# Patient Record
Sex: Male | Born: 1950
Health system: Southern US, Community
[De-identification: ages and names within clinical notes are randomized; demographics above are authoritative.]

## PROBLEM LIST (undated history)

## (undated) DIAGNOSIS — T7840XA Allergy, unspecified, initial encounter: Secondary | ICD-10-CM

## (undated) DIAGNOSIS — K649 Unspecified hemorrhoids: Secondary | ICD-10-CM

## (undated) DIAGNOSIS — H269 Unspecified cataract: Secondary | ICD-10-CM

## (undated) HISTORY — DX: Unspecified cataract: H26.9

## (undated) HISTORY — DX: Allergy, unspecified, initial encounter: T78.40XA

## (undated) HISTORY — PX: ACHILLES TENDON REPAIR: SUR1153

## (undated) HISTORY — DX: Unspecified hemorrhoids: K64.9

---

## 2017-02-28 ENCOUNTER — Ambulatory Visit (HOSPITAL_BASED_OUTPATIENT_CLINIC_OR_DEPARTMENT_OTHER)
Admission: EM | Admit: 2017-02-28 | Discharge: 2017-03-02 | Disposition: A | Discharge status: Home / Self Care | Payer: Self-pay | Attending: General Surgery | Admitting: General Surgery

## 2017-02-28 ENCOUNTER — Encounter (HOSPITAL_BASED_OUTPATIENT_CLINIC_OR_DEPARTMENT_OTHER): Payer: Self-pay | Admitting: *Deleted

## 2017-02-28 ENCOUNTER — Ambulatory Visit (HOSPITAL_BASED_OUTPATIENT_CLINIC_OR_DEPARTMENT_OTHER)
Admit: 2017-02-28 | Discharge: 2017-02-28 | Disposition: A | Discharge status: Home / Self Care | Payer: Self-pay | Attending: Emergency Medicine | Admitting: Emergency Medicine

## 2017-02-28 ENCOUNTER — Emergency Department (HOSPITAL_BASED_OUTPATIENT_CLINIC_OR_DEPARTMENT_OTHER)
Admission: EM | Admit: 2017-02-28 | Discharge: 2017-02-28 | Disposition: A | Discharge status: Home / Self Care | Payer: Self-pay | Source: Home / Self Care | Attending: Emergency Medicine | Admitting: Emergency Medicine

## 2017-02-28 DIAGNOSIS — D72829 Elevated white blood cell count, unspecified: Secondary | ICD-10-CM | POA: Insufficient documentation

## 2017-02-28 DIAGNOSIS — R1011 Right upper quadrant pain: Secondary | ICD-10-CM | POA: Insufficient documentation

## 2017-02-28 DIAGNOSIS — K819 Cholecystitis, unspecified: Secondary | ICD-10-CM

## 2017-02-28 DIAGNOSIS — K8000 Calculus of gallbladder with acute cholecystitis without obstruction: Secondary | ICD-10-CM | POA: Insufficient documentation

## 2017-02-28 DIAGNOSIS — Z79899 Other long term (current) drug therapy: Secondary | ICD-10-CM | POA: Insufficient documentation

## 2017-02-28 DIAGNOSIS — K810 Acute cholecystitis: Secondary | ICD-10-CM | POA: Diagnosis present

## 2017-02-28 LAB — COMPREHENSIVE METABOLIC PANEL
ALK PHOS: 51 U/L (ref 38–126)
ALT: 31 U/L (ref 17–63)
ANION GAP: 8 (ref 5–15)
AST: 34 U/L (ref 15–41)
Albumin: 4.7 g/dL (ref 3.5–5.0)
BILIRUBIN TOTAL: 1 mg/dL (ref 0.3–1.2)
BUN: 15 mg/dL (ref 6–20)
CALCIUM: 9.5 mg/dL (ref 8.9–10.3)
CO2: 29 mmol/L (ref 22–32)
Chloride: 99 mmol/L — ABNORMAL LOW (ref 101–111)
Creatinine, Ser: 0.9 mg/dL (ref 0.61–1.24)
GLUCOSE: 134 mg/dL — AB (ref 65–99)
Potassium: 4 mmol/L (ref 3.5–5.1)
Sodium: 136 mmol/L (ref 135–145)
TOTAL PROTEIN: 7.9 g/dL (ref 6.5–8.1)

## 2017-02-28 LAB — CBC WITH DIFFERENTIAL/PLATELET
Basophils Absolute: 0 10*3/uL (ref 0.0–0.1)
Basophils Relative: 0 %
Eosinophils Absolute: 0 10*3/uL (ref 0.0–0.7)
Eosinophils Relative: 0 %
HEMATOCRIT: 46.8 % (ref 39.0–52.0)
HEMOGLOBIN: 16 g/dL (ref 13.0–17.0)
Lymphocytes Relative: 12 %
Lymphs Abs: 1.4 10*3/uL (ref 0.7–4.0)
MCH: 30.2 pg (ref 26.0–34.0)
MCHC: 34.2 g/dL (ref 30.0–36.0)
MCV: 88.3 fL (ref 78.0–100.0)
MONOS PCT: 4 %
Monocytes Absolute: 0.4 10*3/uL (ref 0.1–1.0)
NEUTROS ABS: 9.7 10*3/uL — AB (ref 1.7–7.7)
NEUTROS PCT: 84 %
Platelets: 280 10*3/uL (ref 150–400)
RBC: 5.3 MIL/uL (ref 4.22–5.81)
RDW: 13 % (ref 11.5–15.5)
WBC: 11.6 10*3/uL — ABNORMAL HIGH (ref 4.0–10.5)

## 2017-02-28 LAB — MRSA PCR SCREENING: MRSA by PCR: NEGATIVE

## 2017-02-28 LAB — LIPASE, BLOOD: Lipase: 38 U/L (ref 11–51)

## 2017-02-28 MED ORDER — HYDROCODONE-ACETAMINOPHEN 5-325 MG PO TABS: 1.0000 | ORAL_TABLET | Freq: Four times a day (QID) | ORAL | 0 refills | Status: DC | PRN

## 2017-02-28 MED ORDER — DIPHENHYDRAMINE HCL 50 MG/ML IJ SOLN: 12.5000 mg | Freq: Four times a day (QID) | INTRAMUSCULAR | Status: DC | PRN

## 2017-02-28 MED ORDER — DEXTROSE 5 % IV SOLN
2.0000 g | INTRAVENOUS | Status: DC
  Administered 2017-02-28: 2 g via INTRAVENOUS
  Filled 2017-02-28: qty 2

## 2017-02-28 MED ORDER — OMEPRAZOLE 40 MG PO CPDR: 40.0000 mg | DELAYED_RELEASE_CAPSULE | Freq: Every day | ORAL | 0 refills | Status: DC | PRN

## 2017-02-28 MED ORDER — KCL IN DEXTROSE-NACL 20-5-0.9 MEQ/L-%-% IV SOLN
INTRAVENOUS | Status: DC
  Administered 2017-02-28: 13:00:00 via INTRAVENOUS
  Administered 2017-03-01: 125 mL/h via INTRAVENOUS
  Filled 2017-02-28 (×4): qty 1000

## 2017-02-28 MED ORDER — DEXTROSE 5 % IV SOLN
2.0000 g | INTRAVENOUS | Status: DC
Start: 2017-03-01 — End: 2017-03-01
  Administered 2017-03-01: 2 g via INTRAVENOUS
  Filled 2017-02-28: qty 2

## 2017-02-28 MED ORDER — TAMSULOSIN HCL 0.4 MG PO CAPS
0.4000 mg | ORAL_CAPSULE | Freq: Every day | ORAL | Status: DC
  Administered 2017-02-28 – 2017-03-01 (×2): 0.4 mg via ORAL
  Filled 2017-02-28 (×2): qty 1

## 2017-02-28 MED ORDER — ONDANSETRON HCL 4 MG/2ML IJ SOLN
4.0000 mg | Freq: Once | INTRAMUSCULAR | Status: AC
  Administered 2017-02-28: 4 mg via INTRAVENOUS
  Filled 2017-02-28: qty 2

## 2017-02-28 MED ORDER — MORPHINE SULFATE (PF) 10 MG/ML IV SOLN
1.0000 mg | INTRAVENOUS | Status: DC | PRN
  Administered 2017-02-28: 3 mg via INTRAVENOUS
  Filled 2017-02-28: qty 1

## 2017-02-28 MED ORDER — ONDANSETRON HCL 4 MG/2ML IJ SOLN: 4.0000 mg | Freq: Four times a day (QID) | INTRAMUSCULAR | Status: DC | PRN

## 2017-02-28 MED ORDER — ONDANSETRON 4 MG PO TBDP: 4.0000 mg | ORAL_TABLET | Freq: Four times a day (QID) | ORAL | 0 refills | Status: DC | PRN

## 2017-02-28 MED ORDER — DIPHENHYDRAMINE HCL 12.5 MG/5ML PO ELIX: 12.5000 mg | ORAL_SOLUTION | Freq: Four times a day (QID) | ORAL | Status: DC | PRN

## 2017-02-28 MED ORDER — MORPHINE SULFATE (PF) 2 MG/ML IV SOLN
2.0000 mg | INTRAVENOUS | Status: DC | PRN
  Administered 2017-02-28: 2 mg via INTRAVENOUS
  Filled 2017-02-28: qty 1

## 2017-02-28 MED ORDER — KETOROLAC TROMETHAMINE 30 MG/ML IJ SOLN
15.0000 mg | Freq: Once | INTRAMUSCULAR | Status: AC
  Administered 2017-02-28: 15 mg via INTRAVENOUS
  Filled 2017-02-28: qty 1

## 2017-02-28 MED ORDER — ONDANSETRON 4 MG PO TBDP: 4.0000 mg | ORAL_TABLET | Freq: Four times a day (QID) | ORAL | Status: DC | PRN

## 2017-02-28 MED ORDER — PANTOPRAZOLE SODIUM 40 MG IV SOLR
40.0000 mg | Freq: Once | INTRAVENOUS | Status: AC
  Administered 2017-02-28: 40 mg via INTRAVENOUS
  Filled 2017-02-28: qty 40

## 2017-02-28 MED FILL — HYDROCODON-APAP 5-325: 5-325 | 2 days supply | Qty: 15 | Fill #0

## 2017-02-28 MED FILL — ONDANSETRON ODT 4 MG TABLET: 4 | 5 days supply | Qty: 20 | Fill #0

## 2017-02-28 MED FILL — OMEPRAZOLE DR 40 MG CAPSULE: 40 | 30 days supply | Qty: 30 | Fill #0

## 2017-02-28 NOTE — ED Provider Notes (Signed)
TIME SEEN: 1:51 AM  CHIEF COMPLAINT: Abdominal pain  HPI: Patient is a 66 year old male with no significant past medical history who presents to the emergency department with complaints of right upper quadrant moderate sharp abdominal pain that started after he ate a ham and cheese sandwich for dinner at 8 PM. Reports pain progressively getting worse. Has had chills but no nausea, vomiting or diarrhea. No dysuria or hematuria. Has never had similar symptoms before. States he has never been told he has gallstones, gastric or duodenal ulcers. No history of endoscopy. Does not drink alcohol regularly, last was 3 years ago. Does not take regular NSAIDs. Did try Tylenol prior to arrival without relief. No history of abdominal surgeries. No aggravating or relieving factors. No radiation of pain. Denies any chest pain. Reports he has chronic shortness of breath that is unchanged.  ROS: See HPI Constitutional: no fever  Eyes: no drainage  ENT: no runny nose   Cardiovascular:  no chest pain  Resp: no new or worsening SOB  GI: no vomiting GU: no dysuria Integumentary: no rash  Allergy: no hives  Musculoskeletal: no leg swelling  Neurological: no slurred speech ROS otherwise negative  PAST MEDICAL HISTORY/PAST SURGICAL HISTORY:  History reviewed. No pertinent past medical history.  MEDICATIONS:  Prior to Admission medications   Medication Sig Start Date End Date Taking? Authorizing Provider  saw palmetto 160 MG capsule Take 160 mg by mouth 2 (two) times daily.   Yes [provider]    ALLERGIES:  Not on File  SOCIAL HISTORY:  Social History  Substance Use Topics  . Smoking status: Never Smoker  . Smokeless tobacco: Never Used  . Alcohol use No    FAMILY HISTORY: History reviewed. No pertinent family history.  EXAM: BP (!) 168/102 (BP Location: Right Arm)   Pulse 79   Temp 97.7 F (36.5 C) (Oral)   Resp 16   Ht 5\' 3"  (1.6 m)   Wt 81.6 kg (180 lb)   SpO2 96%   BMI  31.89 kg/m  CONSTITUTIONAL: Alert and oriented and responds appropriately to questions. Well-appearing; well-nourished HEAD: Normocephalic EYES: Conjunctivae clear, pupils appear equal, EOMI ENT: normal nose; moist mucous membranes NECK: Supple, no meningismus, no nuchal rigidity, no LAD  CARD: RRR; S1 and S2 appreciated; no murmurs, no clicks, no rubs, no gallops RESP: Normal chest excursion without splinting or tachypnea; breath sounds clear and equal bilaterally; no wheezes, no rhonchi, no rales, no hypoxia or respiratory distress, speaking full sentences ABD/GI: Normal bowel sounds; non-distended; soft, Mildly tender in the right upper quadrant with negative Murphy sign, no rebound, no guarding, no peritoneal signs, no hepatosplenomegaly BACK:  The back appears normal and is non-tender to palpation, there is no CVA tenderness EXT: Normal ROM in all joints; non-tender to palpation; no edema; normal capillary refill; no cyanosis, no calf tenderness or swelling    SKIN: Normal color for age and race; warm; no rash NEURO: Moves all extremities equally PSYCH: The patient's mood and manner are appropriate. Grooming and personal hygiene are appropriate.  MEDICAL DECISION MAKING: Patient here with complaints of abdominal pain. He has tenderness in the right upper quadrant but negative Murphy sign. Afebrile here. No tenderness in the epigastric region. Denies burning, sour, bitter taste in his mouth. Denies that this feels like heartburn. No chest pain or new shortness of breath. Differential includes cholelithiasis, pancreatitis, gastritis. I feel is less likely cholecystitis, colitis, appendicitis, diverticulitis, bowel obstruction. Doubt ACS, dissection.  At this time  unfortunately we do not have ultrasound available at this hospital. I do not feel he needs emergent CT imaging unless significant lab abnormalities. Will obtain labs. He did drive himself to the emergency department and would like to  drive himself home if possible. Will treat symptoms with low-dose Toradol, Zofran, Protonix and reassess.  ED PROGRESS: 3:00 AM  Pt has a mild leukocytosis with left shift. Otherwise normal labs with normal creatinine, LFTs and lipase. Patient reports feeling much better after above medications and has been able to sleep comfortably. We have had a lengthy discussion on the plan of care. I have discussed with him that I do not feel CT imaging is the best study to do at this time to evaluate his gallbladder and would expose him to potentially unnecessary radiation. We have discussed the possibility of being transferred to another emergency department tonight have an ultrasound of his gallbladder versus coming back later in the morning to have an ultrasound. He would prefer to come back later in the morning for an ultrasound here at [facility] which I feel is reasonable given he does not appear toxic, septic. He is afebrile at this time and asymptomatic. His abdomen is benign. He has a negative Murphy sign here. His LFTs and lipase are normal. Nothing at this time to suggest gallstone pancreatitis, choledocholithiasis. I did discuss with him that this could be cholelithiasis, biliary colic causing his pain and have recommended if symptoms continue he follow-up with general surgery if his ultrasound is positive for gallstones. Have given her instructions on how to alter his diet and will discharge with prescriptions for Vicodin, Zofran and omeprazole use as needed. I did discuss with him if his ultrasound is unremarkable I recommend close PCP follow-up if symptoms continue as this could be GERD, gastritis. We discussed at length return precautions. Patient verbalizes understanding and is comfortable with this plan.   At this time, I do not feel there is any life-threatening condition present. I have reviewed and discussed all results (EKG, imaging, lab, urine as appropriate) and exam findings with  patient/family. I have reviewed nursing notes and appropriate previous records.  I feel the patient is safe to be discharged home without further emergent workup and can continue workup as an outpatient as needed. Discussed usual and customary return precautions. Patient/family verbalize understanding and are comfortable with this plan.  Outpatient follow-up has been provided if needed. All questions have been answered.      Emmelina Mcloughlin, Delice Bison, DO 02/28/17 [phone]

## 2017-02-28 NOTE — ED Notes (Signed)
Pt verbalizes understanding of d/c instructions and denies any further needs at this time. 

## 2017-02-28 NOTE — ED Triage Notes (Signed)
Pt reports he received an ultrasound this morning which revealed gallstones. Reports being instructed by MD to check into ED.

## 2017-02-28 NOTE — ED Provider Notes (Signed)
[facility] Provider Note   CSN: [id number] Arrival date & time: 02/28/17  3295     History   Chief Complaint Chief Complaint  Patient presents with  . Cholelithiasis    HPI John Zamora is a 66 y.o. male.  Patient is a 66 year old male with no significant past medical history. He presents for evaluation of abdominal pain. He was seen yesterday evening here. He was found to have normal LFTs and lipase, however a mildly elevated white count. He returned today as instructed for an ultrasound. This revealed gallbladder wall thickening with multiple gallstones.    The history is provided by the patient.    History reviewed. No pertinent past medical history.  Patient Active Problem List   Diagnosis Date Noted  . Acute cholecystitis 02/28/2017    Past Surgical History:  Procedure Laterality Date  . ACHILLES TENDON REPAIR Right        Home Medications    Prior to Admission medications   Medication Sig Start Date End Date Taking? Authorizing Provider  HYDROcodone-acetaminophen (NORCO/VICODIN) 5-325 MG tablet Take 1-2 tablets by mouth every 6 (six) hours as needed. 02/28/17  Yes Ward, Delice Bison, DO  omeprazole (PRILOSEC) 40 MG capsule Take 1 capsule (40 mg total) by mouth daily as needed. As needed for acid reflux 02/28/17  Yes Ward, Kristen N, DO  ondansetron (ZOFRAN ODT) 4 MG disintegrating tablet Take 1 tablet (4 mg total) by mouth every 6 (six) hours as needed for nausea or vomiting. 02/28/17  Yes Ward, Delice Bison, DO  saw palmetto 160 MG capsule Take 160 mg by mouth 2 (two) times daily.   Yes [provider]    Family History No family history on file.  Social History Social History  Substance Use Topics  . Smoking status: Never Smoker  . Smokeless tobacco: Never Used  . Alcohol use No     Allergies   Patient has no known allergies.   Review of Systems Review of Systems  All other systems reviewed and are negative.    Physical  Exam Updated Vital Signs BP (!) 145/103 (BP Location: Left Arm)   Pulse 94   Temp 98.4 F (36.9 C) (Oral)   Resp 18   Ht 5\' 3"  (1.6 m)   Wt 81.6 kg (180 lb)   SpO2 96%   BMI 31.89 kg/m   Physical Exam  Constitutional: He is oriented to person, place, and time. He appears well-developed and well-nourished. No distress.  HENT:  Head: Normocephalic and atraumatic.  Mouth/Throat: Oropharynx is clear and moist.  Neck: Normal range of motion. Neck supple.  Cardiovascular: Normal rate and regular rhythm.  Exam reveals no friction rub.   No murmur heard. Pulmonary/Chest: Effort normal and breath sounds normal. No respiratory distress. He has no wheezes. He has no rales.  Abdominal: Soft. Bowel sounds are normal. He exhibits no distension. There is tenderness. There is no rebound and no guarding.  There is tenderness to palpation in the epigastric region and right upper quadrant.  Musculoskeletal: Normal range of motion. He exhibits no edema.  Neurological: He is alert and oriented to person, place, and time. Coordination normal.  Skin: Skin is warm and dry. He is not diaphoretic.  Nursing note and vitals reviewed.    ED Treatments / Results  Labs (all labs ordered are listed, but only abnormal results are displayed) Labs Reviewed - No data to display  EKG  EKG Interpretation None       Radiology  US Abdomen Limited Ruq/gall Bladder  Result Date: 02/28/2017 CLINICAL DATA:  Right upper quadrant pain EXAM: US ABDOMEN LIMITED - RIGHT UPPER QUADRANT COMPARISON:  None. FINDINGS: Gallbladder: Well distended with multiple gallstones within. Gallbladder wall thickening is noted. Common bile duct: Poorly visualized Liver: No focal lesion identified. Within normal limits in parenchymal echogenicity. IMPRESSION: Multiple gallstones with gallbladder wall thickening. In the appropriate clinical setting this would be consistent with acute cholecystitis. Electronically Signed   By: Inez Catalina  M.D.   On: 02/28/2017 08:36    Procedures Procedures (including critical care time)  Medications Ordered in ED Medications  dextrose 5 % and 0.9 % NaCl with KCl 20 mEq/L infusion (not administered)  cefTRIAXone (ROCEPHIN) 2 g in dextrose 5 % 50 mL IVPB (not administered)  morphine 2 MG/ML injection 2 mg (not administered)  diphenhydrAMINE (BENADRYL) 12.5 MG/5ML elixir 12.5 mg (not administered)    Or  diphenhydrAMINE (BENADRYL) injection 12.5 mg (not administered)  ondansetron (ZOFRAN-ODT) disintegrating tablet 4 mg (not administered)    Or  ondansetron (ZOFRAN) injection 4 mg (not administered)     Initial Impression / Assessment and Plan / ED Course  I have reviewed the triage vital signs and the nursing notes.  Pertinent labs & imaging results that were available during my care of the patient were reviewed by me and considered in my medical decision making (see chart for details).  Ultrasound is suggestive of acute cholecystitis. This was discussed with Kathlee Nations, [place] for Dr. Dalbert Batman at [facility]. The patient will be admitted for cholecystectomy.  Final Clinical Impressions(s) / ED Diagnoses   Final diagnoses:  None    New Prescriptions New Prescriptions   No medications on file     Veryl Speak, MD 02/28/17 1003

## 2017-02-28 NOTE — H&P (Signed)
John Zamora is an 66 y.o. male.   Chief Complaint: RUQ pain after eating ham and cheese last PM.   HPI: Healthy gentleman who started having pain after eating ham and cheese sandwich last PM around 8 PM.  Pain got progressively worse, he had chills but no nausea or vomiting.  No prior hx of GI issues, tried Tylenol without any effect.  Work up in ED at [facility] is afebrile but hypertensive.  Labs show his WBC is up some 11.6, H/H is 16/46, LFT's and lipase are normal.  Abdominal Ultrasound shows:  Multiple gallstones with gallbladder wall thickening. In the appropriate clinical setting this would be consistent with acute cholecystitis.  CBD poorly visualized.  He was transferred to [facility] for further evaluation and Rx  Past Medical History:  Diagnosis Date  . Dyspnea     Past Surgical History:  Procedure Laterality Date  . ACHILLES TENDON REPAIR Right     History reviewed. No pertinent family history. Social History:  reports that he has never smoked. He has never used smokeless tobacco. He reports that he does not drink alcohol or use drugs. 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 HYDROcodone-acetaminophen (NORCO/VICODIN) 5-325 MG tablet Take 1-2 tablets by mouth every 6 (six) hours as needed. 15 tablet 0  . Nutritional Supplements (CARNATION BREAKFAST ESSENTIALS PO) Take 1 packet by mouth daily. chocolate    . saw palmetto 160 MG capsule Take 160 mg by mouth 2 (two) times daily.    Marland Kitchen omeprazole (PRILOSEC) 40 MG capsule Take 1 capsule (40 mg total) by mouth daily as needed. As needed for acid reflux 30 capsule 0  . ondansetron (ZOFRAN ODT) 4 MG disintegrating tablet Take 1 tablet (4 mg total) by mouth every 6 (six) hours as needed for nausea or vomiting. 20 tablet 0    Results for orders placed or performed during the hospital encounter of  02/28/17 (from the past 48 hour(s))  MRSA PCR Screening     Status: None   Collection Time: 02/28/17 12:20 PM  Result Value Ref Range   MRSA by PCR NEGATIVE NEGATIVE    Comment:        The GeneXpert MRSA Assay (FDA approved for NASAL specimens only), is one component of a comprehensive MRSA colonization surveillance program. It is not intended to diagnose MRSA infection nor to guide or monitor treatment for MRSA infections.    US Abdomen Limited Ruq/gall Bladder  Result Date: 02/28/2017 CLINICAL DATA:  Right upper quadrant pain EXAM: US ABDOMEN LIMITED - RIGHT UPPER QUADRANT COMPARISON:  None. FINDINGS: Gallbladder: Well distended with multiple gallstones within. Gallbladder wall thickening is noted. Common bile duct: Poorly visualized Liver: No focal lesion identified. Within normal limits in parenchymal echogenicity. IMPRESSION: Multiple gallstones with gallbladder wall thickening. In the appropriate clinical setting this would be consistent with acute cholecystitis. Electronically Signed   By: Inez Catalina M.D.   On: 02/28/2017 08:36    Review of Systems  Constitutional: Positive for chills. Negative for diaphoresis, fever, malaise/fatigue and weight loss.       Has been gaining weight.  HENT: Positive for hearing loss.   Eyes: Negative.   Respiratory: Positive for shortness of breath (DOE). Negative for cough, hemoptysis, sputum production and wheezing.   Cardiovascular: Positive for leg swelling. Negative for chest pain, palpitations, orthopnea, claudication and PND.  Gastrointestinal: Positive  for abdominal pain (diffuse upper half of abdomen, now more in theRUQ), blood in stool (rare with hemorrhoids) and constipation. Negative for diarrhea, heartburn, nausea and vomiting.  Genitourinary: Positive for frequency.  Musculoskeletal: Negative.   Skin: Negative.   Neurological: Positive for dizziness (standing quickly). Negative for tingling, tremors, sensory change, speech change,  focal weakness, seizures, loss of consciousness, weakness and headaches.  Endo/Heme/Allergies: Negative.   Psychiatric/Behavioral: Negative for depression, hallucinations, memory loss, substance abuse and suicidal ideas. The patient is not nervous/anxious and does not have insomnia.        Son lives with them and has some depression issues    Blood pressure (!) 152/99, pulse 83, temperature 98.4 F (36.9 C), temperature source Oral, resp. rate 20, height 5\' 3"  (1.6 m), weight 81.6 kg (180 lb), SpO2 96 %. Physical Exam  Constitutional: He is oriented to person, place, and time. He appears well-developed and well-nourished. No distress.  HENT:  Head: Normocephalic.  Mouth/Throat: Oropharynx is clear and moist. No oropharyngeal exudate.  Eyes: Right eye exhibits no discharge. Left eye exhibits no discharge. No scleral icterus.  Pupils equal  Neck: Normal range of motion. Neck supple. No JVD present. No tracheal deviation present. No thyromegaly present.  Cardiovascular: Normal rate, regular rhythm, normal heart sounds and intact distal pulses.   No murmur heard. Respiratory: Effort normal and breath sounds normal. No respiratory distress. He has no wheezes. He has no rales.  GI: Soft. Bowel sounds are normal. He exhibits no distension. There is tenderness. There is no rebound and no guarding.  Some tenderness 1 spot RUQ.Marland KitchenNot much pain now with meds on board.  Musculoskeletal: He exhibits no edema or tenderness.  Lymphadenopathy:    He has no cervical adenopathy.  Neurological: He is alert and oriented to person, place, and time. No cranial nerve deficit.  Skin: Skin is warm and dry. No rash noted. He is not diaphoretic. No erythema. No pallor.  Psychiatric: He has a normal mood and affect. His behavior is normal. Judgment and thought content normal.     Assessment/Plan Cholelithiasis/Cholecystitis BPH Dehydration   Plan:  Clears for now, pain meds PRN, start on Rocephin, will recheck  labs in AM.  Kahlyn Shippey, PA-C 02/28/2017, 2:21 PM

## 2017-02-28 NOTE — ED Notes (Signed)
Pt states his pain has improved and that he was actually able to fall asleep for a little bit.

## 2017-02-28 NOTE — ED Triage Notes (Signed)
p with upper abd pain that began last PM just after eating supper. Denies N/V/D or fever denies urinary sx

## 2017-02-28 NOTE — Discharge Instructions (Signed)
Please return today for an ultrasound of your gallbladder. If your ultrasound is normal, I recommend close follow-up with primary care physician. If your ultrasound shows gallstones and you continued to have symptoms I recommend follow-up with general surgery. If you ever have worsening symptoms such as worsening pain, chest pain or worsening shortness of breath, fever of 100.4 higher, vomiting and cannot stop, blood in your stool or black and tarry stools, please return to the emergency department.    To find a primary care or specialty doctor please call [phone] or [phone] to access "[facility]."  You may also go on the [facility] website at [email or web address]  There are also multiple [facility], [facility], [facility] and [facility] practices throughout the [place] that are frequently accepting new patients. You may find a clinic that is close to your home and contact them.  [place] [id number] [place]  [place] [address] [address] [place] [phone]

## 2017-03-01 ENCOUNTER — Encounter (HOSPITAL_COMMUNITY)
Admission: EM | Disposition: A | Discharge status: Home / Self Care | Payer: Self-pay | Source: Home / Self Care | Attending: Emergency Medicine

## 2017-03-01 ENCOUNTER — Encounter (HOSPITAL_COMMUNITY): Payer: Self-pay | Admitting: Anesthesiology

## 2017-03-01 ENCOUNTER — Observation Stay (HOSPITAL_COMMUNITY): Payer: Self-pay | Admitting: Anesthesiology

## 2017-03-01 ENCOUNTER — Observation Stay (HOSPITAL_COMMUNITY): Payer: Self-pay

## 2017-03-01 DIAGNOSIS — K8000 Calculus of gallbladder with acute cholecystitis without obstruction: Secondary | ICD-10-CM | POA: Diagnosis present

## 2017-03-01 HISTORY — PX: CHOLECYSTECTOMY: SHX55

## 2017-03-01 LAB — COMPREHENSIVE METABOLIC PANEL
ALBUMIN: 3.5 g/dL (ref 3.5–5.0)
ALK PHOS: 61 U/L (ref 38–126)
ALT: 167 U/L — ABNORMAL HIGH (ref 17–63)
ANION GAP: 7 (ref 5–15)
AST: 127 U/L — ABNORMAL HIGH (ref 15–41)
BUN: 11 mg/dL (ref 6–20)
CALCIUM: 8.4 mg/dL — AB (ref 8.9–10.3)
CO2: 26 mmol/L (ref 22–32)
Chloride: 104 mmol/L (ref 101–111)
Creatinine, Ser: 0.72 mg/dL (ref 0.61–1.24)
GFR calc non Af Amer: >60 mL/min (ref >60)
GLUCOSE: 143 mg/dL — AB (ref 65–99)
POTASSIUM: 4.2 mmol/L (ref 3.5–5.1)
SODIUM: 137 mmol/L (ref 135–145)
Total Bilirubin: 3.3 mg/dL — ABNORMAL HIGH (ref 0.3–1.2)
Total Protein: 6.7 g/dL (ref 6.5–8.1)

## 2017-03-01 LAB — CBC
HCT: 43 % (ref 39.0–52.0)
HEMOGLOBIN: 14.3 g/dL (ref 13.0–17.0)
MCH: 29.9 pg (ref 26.0–34.0)
MCHC: 33.3 g/dL (ref 30.0–36.0)
MCV: 89.8 fL (ref 78.0–100.0)
PLATELETS: 224 10*3/uL (ref 150–400)
RBC: 4.79 MIL/uL (ref 4.22–5.81)
RDW: 13.2 % (ref 11.5–15.5)
WBC: 10.8 10*3/uL — ABNORMAL HIGH (ref 4.0–10.5)

## 2017-03-01 SURGERY — LAPAROSCOPIC CHOLECYSTECTOMY WITH INTRAOPERATIVE CHOLANGIOGRAM
Anesthesia: General

## 2017-03-01 SURGERY — LAPAROSCOPIC CHOLECYSTECTOMY WITH INTRAOPERATIVE CHOLANGIOGRAM
Anesthesia: General | Site: Abdomen

## 2017-03-01 MED ORDER — BUPIVACAINE HCL (PF) 0.5 % IJ SOLN
INTRAMUSCULAR | Status: DC | PRN
  Administered 2017-03-01: 15 mL

## 2017-03-01 MED ORDER — PROMETHAZINE HCL 25 MG/ML IJ SOLN: 6.2500 mg | INTRAMUSCULAR | Status: DC | PRN

## 2017-03-01 MED ORDER — HYDROCODONE-ACETAMINOPHEN 5-325 MG PO TABS: 1.0000 | ORAL_TABLET | ORAL | Status: DC | PRN

## 2017-03-01 MED ORDER — IOPAMIDOL (ISOVUE-300) INJECTION 61%
INTRAVENOUS | Status: DC | PRN
  Administered 2017-03-01: 5 mL

## 2017-03-01 MED ORDER — DEXAMETHASONE SODIUM PHOSPHATE 10 MG/ML IJ SOLN
INTRAMUSCULAR | Status: DC | PRN
  Administered 2017-03-01: 10 mg via INTRAVENOUS

## 2017-03-01 MED ORDER — LACTATED RINGERS IV SOLN
INTRAVENOUS | Status: DC
  Administered 2017-03-01 – 2017-03-02 (×2): via INTRAVENOUS

## 2017-03-01 MED ORDER — LACTATED RINGERS IV SOLN
INTRAVENOUS | Status: DC
  Administered 2017-03-01 (×2): via INTRAVENOUS

## 2017-03-01 MED ORDER — FENTANYL CITRATE (PF) 100 MCG/2ML IJ SOLN
INTRAMUSCULAR | Status: DC | PRN
  Administered 2017-03-01: 50 ug via INTRAVENOUS
  Administered 2017-03-01: 150 ug via INTRAVENOUS
  Administered 2017-03-01 (×3): 50 ug via INTRAVENOUS

## 2017-03-01 MED ORDER — FENTANYL CITRATE (PF) 100 MCG/2ML IJ SOLN
INTRAMUSCULAR | Status: AC
  Filled 2017-03-01: qty 2

## 2017-03-01 MED ORDER — ONDANSETRON 4 MG PO TBDP: 4.0000 mg | ORAL_TABLET | Freq: Four times a day (QID) | ORAL | Status: DC | PRN

## 2017-03-01 MED ORDER — ENOXAPARIN SODIUM 40 MG/0.4ML ~~LOC~~ SOLN
40.0000 mg | SUBCUTANEOUS | Status: DC
  Administered 2017-03-02: 40 mg via SUBCUTANEOUS
  Filled 2017-03-01: qty 0.4

## 2017-03-01 MED ORDER — HYDROMORPHONE HCL 1 MG/ML IJ SOLN: 1.0000 mg | INTRAMUSCULAR | Status: DC | PRN

## 2017-03-01 MED ORDER — SUGAMMADEX SODIUM 200 MG/2ML IV SOLN
INTRAVENOUS | Status: DC | PRN
  Administered 2017-03-01: 200 mg via INTRAVENOUS

## 2017-03-01 MED ORDER — ROCURONIUM BROMIDE 10 MG/ML (PF) SYRINGE
PREFILLED_SYRINGE | INTRAVENOUS | Status: DC | PRN
  Administered 2017-03-01: 10 mg via INTRAVENOUS
  Administered 2017-03-01: 5 mg via INTRAVENOUS
  Administered 2017-03-01: 30 mg via INTRAVENOUS
  Administered 2017-03-01 (×2): 5 mg via INTRAVENOUS

## 2017-03-01 MED ORDER — LIDOCAINE 2% (20 MG/ML) 5 ML SYRINGE
INTRAMUSCULAR | Status: DC | PRN
  Administered 2017-03-01: 100 mg via INTRAVENOUS

## 2017-03-01 MED ORDER — PROPOFOL 10 MG/ML IV BOLUS
INTRAVENOUS | Status: AC
  Filled 2017-03-01: qty 20

## 2017-03-01 MED ORDER — KETOROLAC TROMETHAMINE 30 MG/ML IJ SOLN: 30.0000 mg | Freq: Once | INTRAMUSCULAR | Status: DC | PRN

## 2017-03-01 MED ORDER — ONDANSETRON HCL 4 MG/2ML IJ SOLN
INTRAMUSCULAR | Status: AC
  Filled 2017-03-01: qty 2

## 2017-03-01 MED ORDER — FENTANYL CITRATE (PF) 250 MCG/5ML IJ SOLN
INTRAMUSCULAR | Status: AC
  Filled 2017-03-01: qty 5

## 2017-03-01 MED ORDER — ONDANSETRON HCL 4 MG/2ML IJ SOLN
INTRAMUSCULAR | Status: DC | PRN
  Administered 2017-03-01: 4 mg via INTRAVENOUS

## 2017-03-01 MED ORDER — 0.9 % SODIUM CHLORIDE (POUR BTL) OPTIME
TOPICAL | Status: DC | PRN
  Administered 2017-03-01: 1000 mL

## 2017-03-01 MED ORDER — SUCCINYLCHOLINE CHLORIDE 200 MG/10ML IV SOSY
PREFILLED_SYRINGE | INTRAVENOUS | Status: DC | PRN
  Administered 2017-03-01: 120 mg via INTRAVENOUS

## 2017-03-01 MED ORDER — LIDOCAINE 2% (20 MG/ML) 5 ML SYRINGE
INTRAMUSCULAR | Status: AC
  Filled 2017-03-01: qty 5

## 2017-03-01 MED ORDER — PANTOPRAZOLE SODIUM 40 MG PO TBEC
40.0000 mg | DELAYED_RELEASE_TABLET | Freq: Every day | ORAL | Status: DC
  Administered 2017-03-02: 40 mg via ORAL
  Filled 2017-03-01: qty 1

## 2017-03-01 MED ORDER — ONDANSETRON HCL 4 MG/2ML IJ SOLN: 4.0000 mg | Freq: Four times a day (QID) | INTRAMUSCULAR | Status: DC | PRN

## 2017-03-01 MED ORDER — MIDAZOLAM HCL 2 MG/2ML IJ SOLN
INTRAMUSCULAR | Status: DC | PRN
  Administered 2017-03-01: 2 mg via INTRAVENOUS

## 2017-03-01 MED ORDER — SUCCINYLCHOLINE CHLORIDE 200 MG/10ML IV SOSY
PREFILLED_SYRINGE | INTRAVENOUS | Status: AC
  Filled 2017-03-01: qty 10

## 2017-03-01 MED ORDER — LACTATED RINGERS IR SOLN
Status: DC | PRN
  Administered 2017-03-01 (×2): 1000 mL

## 2017-03-01 MED ORDER — ROCURONIUM BROMIDE 10 MG/ML (PF) SYRINGE: PREFILLED_SYRINGE | INTRAVENOUS | Status: DC | PRN

## 2017-03-01 MED ORDER — HYDROMORPHONE HCL 1 MG/ML IJ SOLN: 0.2500 mg | INTRAMUSCULAR | Status: DC | PRN

## 2017-03-01 MED ORDER — ROCURONIUM BROMIDE 50 MG/5ML IV SOSY
PREFILLED_SYRINGE | INTRAVENOUS | Status: AC
  Filled 2017-03-01: qty 5

## 2017-03-01 MED ORDER — MENTHOL 3 MG MT LOZG
1.0000 | LOZENGE | OROMUCOSAL | Status: DC | PRN
  Administered 2017-03-01: 3 mg via ORAL
  Filled 2017-03-01: qty 9

## 2017-03-01 MED ORDER — DIPHENHYDRAMINE HCL 50 MG/ML IJ SOLN
INTRAMUSCULAR | Status: DC | PRN
  Administered 2017-03-01: 25 mg via INTRAVENOUS

## 2017-03-01 MED ORDER — PROPOFOL 10 MG/ML IV BOLUS
INTRAVENOUS | Status: DC | PRN
  Administered 2017-03-01: 150 mg via INTRAVENOUS

## 2017-03-01 SURGICAL SUPPLY — 30 items
APPLIER CLIP ROT 10 11.4 M/L (STAPLE) ×2
CABLE HIGH FREQUENCY MONO STRZ (ELECTRODE) ×2 IMPLANT
CLIP APPLIE ROT 10 11.4 M/L (STAPLE) ×1 IMPLANT
COVER MAYO STAND STRL (DRAPES) ×2 IMPLANT
COVER SURGICAL LIGHT HANDLE (MISCELLANEOUS) ×2 IMPLANT
DECANTER SPIKE VIAL GLASS SM (MISCELLANEOUS) ×2 IMPLANT
DERMABOND ADVANCED (GAUZE/BANDAGES/DRESSINGS) ×1
DERMABOND ADVANCED .7 DNX12 (GAUZE/BANDAGES/DRESSINGS) ×1 IMPLANT
DRAPE C-ARM 42X120 X-RAY (DRAPES) ×2 IMPLANT
ELECT REM PT RETURN 15FT ADLT (MISCELLANEOUS) ×2 IMPLANT
GLOVE EUDERMIC 7 POWDERFREE (GLOVE) ×2 IMPLANT
GOWN STRL REUS W/TWL XL LVL3 (GOWN DISPOSABLE) ×8 IMPLANT
HEMOSTAT SNOW SURGICEL 2X4 (HEMOSTASIS) ×4 IMPLANT
KIT BASIN OR (CUSTOM PROCEDURE TRAY) ×2 IMPLANT
POSITIONER SURGICAL ARM (MISCELLANEOUS) IMPLANT
POUCH SPECIMEN RETRIEVAL 10MM (ENDOMECHANICALS) ×2 IMPLANT
SCISSORS LAP 5X35 DISP (ENDOMECHANICALS) ×2 IMPLANT
SET CHOLANGIOGRAPH MIX (MISCELLANEOUS) ×2 IMPLANT
SET IRRIG TUBING LAPAROSCOPIC (IRRIGATION / IRRIGATOR) ×2 IMPLANT
SLEEVE XCEL OPT CAN 5 100 (ENDOMECHANICALS) ×2 IMPLANT
SUT MNCRL AB 4-0 PS2 18 (SUTURE) ×2 IMPLANT
SUT VICRYL 0 UR6 27IN ABS (SUTURE) ×2 IMPLANT
TAPE CLOTH 4X10 WHT NS (GAUZE/BANDAGES/DRESSINGS) IMPLANT
TOWEL OR 17X26 10 PK STRL BLUE (TOWEL DISPOSABLE) ×2 IMPLANT
TOWEL OR NON WOVEN STRL DISP B (DISPOSABLE) ×2 IMPLANT
TRAY LAPAROSCOPIC (CUSTOM PROCEDURE TRAY) ×2 IMPLANT
TROCAR BLADELESS OPT 5 100 (ENDOMECHANICALS) ×2 IMPLANT
TROCAR XCEL BLUNT TIP 100MML (ENDOMECHANICALS) ×2 IMPLANT
TROCAR XCEL NON-BLD 11X100MML (ENDOMECHANICALS) ×2 IMPLANT
TUBING INSUF HEATED (TUBING) ×2 IMPLANT

## 2017-03-01 NOTE — Progress Notes (Signed)
  Subjective: Stable and alert.  Had a pretty good night.  Says he still has some right upper quadrant pain but not as bad no nausea or vomiting On IV Rocephin. All questions answered regarding planned laparoscopic cholecystectomy today Morning lab work pending  Objective: Vital signs in last 24 hours: Temp:  [97.8 F (36.6 C)-98.6 F (37 C)] 97.8 F (36.6 C) (05/31 0056) Pulse Rate:  [78-95] 79 (05/31 0056) Resp:  [18-31] 18 (05/31 0056) BP: (124-153)/(87-103) 124/87 (05/31 0056) SpO2:  [90 %-99 %] 94 % (05/31 0056) Weight:  [81.6 kg (180 lb)] 81.6 kg (180 lb) (05/30 0946) Last BM Date: 02/27/17  Intake/Output from previous day: 05/30 0701 - 05/31 0700 In: 1223.3 [P.O.:360; I.V.:813.3; IV Piggyback:50] Out: 650 [Urine:650] Intake/Output this shift: Total I/O In: 250 [I.V.:250] Out: 350 [Urine:350]  General appearance: alert and cooperative Resp: clear to auscultation bilaterally GI: Soft.  Nondistended.  Her right upper quadrant but no mass or guarding  Lab Results:   Recent Labs  02/28/17 0206  WBC 11.6*  HGB 16.0  HCT 46.8  PLT 280   BMET  Recent Labs  02/28/17 0206  NA 136  K 4.0  CL 99*  CO2 29  GLUCOSE 134*  BUN 15  CREATININE 0.90  CALCIUM 9.5   PT/INR No results for input(s): LABPROT, INR in the last 72 hours. ABG No results for input(s): PHART, HCO3 in the last 72 hours.  Invalid input(s): PCO2, PO2  Studies/Results: US Abdomen Limited Ruq/gall Bladder  Result Date: 02/28/2017 CLINICAL DATA:  Right upper quadrant pain EXAM: US ABDOMEN LIMITED - RIGHT UPPER QUADRANT COMPARISON:  None. FINDINGS: Gallbladder: Well distended with multiple gallstones within. Gallbladder wall thickening is noted. Common bile duct: Poorly visualized Liver: No focal lesion identified. Within normal limits in parenchymal echogenicity. IMPRESSION: Multiple gallstones with gallbladder wall thickening. In the appropriate clinical setting this would be consistent with  acute cholecystitis. Electronically Signed   By: Inez Catalina M.D.   On: 02/28/2017 08:36    Anti-infectives: Anti-infectives    Start     Dose/Rate Route Frequency Ordered Stop   03/01/17 1000  cefTRIAXone (ROCEPHIN) 2 g in dextrose 5 % 50 mL IVPB     2 g 100 mL/hr over 30 Minutes Intravenous Every 24 hours 02/28/17 1500     02/28/17 1015  cefTRIAXone (ROCEPHIN) 2 g in dextrose 5 % 50 mL IVPB  Status:  Discontinued     2 g 100 mL/hr over 30 Minutes Intravenous Every 24 hours 02/28/17 1001 02/28/17 1500      Assessment/Plan: s/p Procedure(s): LAPAROSCOPIC CHOLECYSTECTOMY WITH INTRAOPERATIVE CHOLANGIOGRAM  Acute cholecystitis with cholelithiasis Proceed with laparoscopic cholecystectomy and possible cholangiogram today All his questions have been answered and he agrees with this plan.   LOS: 0 days    Sharlette Jansma M 03/01/2017

## 2017-03-01 NOTE — Anesthesia Procedure Notes (Addendum)
Procedure Name: Intubation Date/Time: 03/01/2017 12:35 PM Performed by: Danley Danker L Patient Re-evaluated:Patient Re-evaluated prior to inductionOxygen Delivery Method: Circle system utilized Preoxygenation: Pre-oxygenation with 100% oxygen Intubation Type: IV induction Laryngoscope Size: Glidescope and 3 Grade View: Grade I Tube type: Parker flex tip Number of attempts: 1 Airway Equipment and Method: Video-laryngoscopy Placement Confirmation: ETT inserted through vocal cords under direct vision,  positive ETCO2 and breath sounds checked- equal and bilateral Secured at: 21 cm Tube secured with: Tape Dental Injury: Teeth and Oropharynx as per pre-operative assessment  Difficulty Due To: Difficulty was anticipated, Difficult Airway- due to anterior larynx and Difficult Airway- due to limited oral opening

## 2017-03-01 NOTE — Op Note (Signed)
Patient Name:           John Zamora   Date of Surgery:        03/01/2017  Pre op Diagnosis:      Acute cholecystitis with cholelithiasis  Post op Diagnosis:    Same  Procedure:                 Laparoscopic cholecystectomy with intraoperative cholangiogram  Surgeon:                     Edsel Petrin. Dalbert Batman, M.D., FACS  Assistant:                      Or staff   Indication for Assistant: n/a  Operative Indications:   This is a 66 year old gentleman who started having pain after eating a ham and cheese sandwich on 02/27/2017.  The pain got progressively worse.  But no nausea or vomiting.  No prior history of GI issues.  Workup in the emergency department showed WBC of 11,600, hemoglobin 16.  Liver function tests and lipase normal.  Ultrasound showed multiple gallstones with gallbladder wall thickening.  CBD poorly visualized.  He was admitted at 4:30 PM yesterday and stabilized on antibiotics.  Pain improved but he remains somewhat tender.  Liver function tests were elevated this morning with bilirubin of 3.3 and somewhat elevated AST and ALTs.  He is brought to the operating room for cholecystectomy with cholangiogram  Operative Findings:       The gallbladder was thick-walled and acutely inflamed.  The fundus of the gallbladder was discolored and looked somewhat necrotic.  The gallbladder contained innumerable stones.  Several of the stones spilled out into the subhepatic space and had to be retrieved.  I spent almost 30 minutes retrieving stones and washing out the subphrenic and subhepatic spaces.  I felt that all the stones have been retrieved.  The cholangiogram showed normal intrahepatic and extrahepatic biliary anatomy.  The biliary tree was not dilated.  Early in the cholangiogram it looked like there might be a tiny amount of sludge in the distal CBD, but this flushed right through into the duodenum and the cholangiogram was basically normal at that point.  The liver, stomach, duodenum, small  intestine, large intestine were otherwise grossly normal.  Procedure in Detail:          Following the induction of general endotracheal anesthesia the patient's abdomen was prepped and draped in a sterile fashion.  Intravenous antibiotic given.  Surgical timeout was performed.  0.5% Marcaine was used as local infiltration anesthetic.  A vertically oriented incision was made in the lower rim of the umbilicus.  The fascia was incised in the midline and the abdominal cavity entered under direct vision.  An 11 mm Hassan trocar was inserted and secured with the Purstring suture of 0 Vicryl.  Pneumoperitoneum was created and video camera was inserted.  Trochars placed in subxiphoid region and two 5 mm trochars placed in the right upper quadrant.  The gallbladder was tense and thick and so we chose to aspirate this with a suction trocar which helped with manipulation.  We took the adhesions down off of the infundibulum of the gallbladder.  We slowly dissected the neck of the gallbladder.  We ultimately created a nice critical view of safety behind the cystic duct and the cystic artery seen clearly through to the liver.  We control the cystic artery with multiple medical clips and divided it.  A cholangiogram was obtained through the cystic duct using the C-arm and the cholangiogram was basically normal as described above.  The cholangiogram catheter was removed.  The cystic duct was secured with multiple and divided.     The gallbladder was dissected from its bed with electrocautery.  We made a hole in the gallbladder and spilled several stones..  We retrieve these as they fell out.  We placed the gallbladder in a specimen bag and removed through the umbilical port.  We had to widen this port site some.  I spent a long time irrigating and removing stones.  We probably retrieved 20 stones.  I could not find any stones in the subphrenic space, subhepatic space, hepatorenal pouch, dome of the liver or omentum after this  was done I felt that I got all of the stones out.    Hemostasis was good.  The liver bed was raw.  It was cauterized a great deal.  Was no bleeding or bile leak but I placed a piece of hemostatic sponge, Snow brand. and that looked good at the completion of the case.     We closed the fascia at the umbilicus with multiple interrupted sutures of 0 Vicryl.  We remove the rest of the trochars and released the pneumoperitoneum.  Incisions were irrigated and the skin incisions were closed with subcuticular sutures of 4-0 Monocryl and Dermabond.  The patient tolerated the procedure well and was taken to PACU in stable condition.  EBL 100 - 150 mL.  Counts correct.  Complications none.    Edsel Petrin. Dalbert Batman, M.D., FACS General and Minimally Invasive Surgery Breast and Colorectal Surgery  03/01/2017 2:38 PM

## 2017-03-01 NOTE — Transfer of Care (Signed)
Immediate Anesthesia Transfer of Care Note  Patient: John Zamora  Procedure(s) Performed: Procedure(s): LAPAROSCOPIC CHOLECYSTECTOMY WITH INTRAOPERATIVE CHOLANGIOGRAM (N/A)  Patient Location: PACU  Anesthesia Type:General  Level of Consciousness: awake  Airway & Oxygen Therapy: Patient Spontanous Breathing and Patient connected to face mask oxygen  Post-op Assessment: Report given to RN and Post -op Vital signs reviewed and stable  Post vital signs: Reviewed and stable  Last Vitals:  Vitals:   03/01/17 1108 03/01/17 1445  BP: 134/85   Pulse: 81 99  Resp: 18 17  Temp: 37.1 C 37.1 C    Last Pain:  Vitals:   03/01/17 1108  TempSrc: Oral  PainSc:       Patients Stated Pain Goal: 2 (31/59/45 8592)  Complications: No apparent anesthesia complications

## 2017-03-01 NOTE — Anesthesia Preprocedure Evaluation (Signed)
Anesthesia Evaluation  Patient identified by MRN, date of birth, ID band Patient awake    Reviewed: Allergy & Precautions, NPO status , Patient's Chart, lab work & pertinent test results  Airway Mallampati: II  TM Distance: >3 FB Neck ROM: Full    Dental no notable dental hx.    Pulmonary neg pulmonary ROS,    Pulmonary exam normal breath sounds clear to auscultation       Cardiovascular negative cardio ROS Normal cardiovascular exam Rhythm:Regular Rate:Normal     Neuro/Psych negative neurological ROS  negative psych ROS   GI/Hepatic negative GI ROS, Neg liver ROS,   Endo/Other  negative endocrine ROS  Renal/GU negative Renal ROS  negative genitourinary   Musculoskeletal negative musculoskeletal ROS (+)   Abdominal   Peds negative pediatric ROS (+)  Hematology negative hematology ROS (+)   Anesthesia Other Findings   Reproductive/Obstetrics negative OB ROS                             Anesthesia Physical Anesthesia Plan  ASA: II  Anesthesia Plan: General   Post-op Pain Management:    Induction: Intravenous  Airway Management Planned: Oral ETT  Additional Equipment:   Intra-op Plan:   Post-operative Plan: Extubation in OR  Informed Consent: I have reviewed the patients History and Physical, chart, labs and discussed the procedure including the risks, benefits and alternatives for the proposed anesthesia with the patient or authorized representative who has indicated his/her understanding and acceptance.   Dental advisory given  Plan Discussed with: CRNA and Surgeon  Anesthesia Plan Comments:         Anesthesia Quick Evaluation

## 2017-03-01 NOTE — Anesthesia Postprocedure Evaluation (Signed)
Anesthesia Post Note  Patient: Clayborn Milnes  Procedure(s) Performed: Procedure(s) (LRB): LAPAROSCOPIC CHOLECYSTECTOMY WITH INTRAOPERATIVE CHOLANGIOGRAM (N/A)     Patient location during evaluation: PACU Anesthesia Type: General Level of consciousness: awake and alert Pain management: pain level controlled Vital Signs Assessment: post-procedure vital signs reviewed and stable Respiratory status: spontaneous breathing, nonlabored ventilation, respiratory function stable and patient connected to nasal cannula oxygen Cardiovascular status: blood pressure returned to baseline and stable Postop Assessment: no signs of nausea or vomiting Anesthetic complications: no    Last Vitals:  Vitals:   03/01/17 1515 03/01/17 1530  BP: (!) 142/87 (!) 143/93  Pulse: 92 92  Resp: 14 19  Temp:  37.2 C    Last Pain:  Vitals:   03/01/17 1530  TempSrc:   PainSc: 3                  Jametta Moorehead S

## 2017-03-02 ENCOUNTER — Encounter (HOSPITAL_COMMUNITY): Payer: Self-pay | Admitting: General Surgery

## 2017-03-02 LAB — COMPREHENSIVE METABOLIC PANEL
ALBUMIN: 3.2 g/dL — AB (ref 3.5–5.0)
ALK PHOS: 57 U/L (ref 38–126)
ALT: 152 U/L — ABNORMAL HIGH (ref 17–63)
ANION GAP: 8 (ref 5–15)
AST: 84 U/L — ABNORMAL HIGH (ref 15–41)
BUN: 13 mg/dL (ref 6–20)
CALCIUM: 8.6 mg/dL — AB (ref 8.9–10.3)
CHLORIDE: 102 mmol/L (ref 101–111)
CO2: 27 mmol/L (ref 22–32)
Creatinine, Ser: 0.79 mg/dL (ref 0.61–1.24)
GFR calc non Af Amer: >60 mL/min (ref >60)
GLUCOSE: 133 mg/dL — AB (ref 65–99)
POTASSIUM: 4 mmol/L (ref 3.5–5.1)
SODIUM: 137 mmol/L (ref 135–145)
Total Bilirubin: 1.2 mg/dL (ref 0.3–1.2)
Total Protein: 6.3 g/dL — ABNORMAL LOW (ref 6.5–8.1)

## 2017-03-02 LAB — CBC
HEMATOCRIT: 41.3 % (ref 39.0–52.0)
HEMOGLOBIN: 13.7 g/dL (ref 13.0–17.0)
MCH: 29.7 pg (ref 26.0–34.0)
MCHC: 33.2 g/dL (ref 30.0–36.0)
MCV: 89.4 fL (ref 78.0–100.0)
Platelets: 239 10*3/uL (ref 150–400)
RBC: 4.62 MIL/uL (ref 4.22–5.81)
RDW: 13 % (ref 11.5–15.5)
WBC: 17.4 10*3/uL — ABNORMAL HIGH (ref 4.0–10.5)

## 2017-03-02 MED ORDER — HYDROCODONE-ACETAMINOPHEN 5-325 MG PO TABS: 1.0000 | ORAL_TABLET | Freq: Four times a day (QID) | ORAL | 0 refills | Status: DC | PRN

## 2017-03-02 NOTE — Discharge Instructions (Signed)
Please arrive at least 30 min before your appointment to complete your check in paperwork.  If you are unable to arrive 30 min prior to your appointment time we may have to cancel or reschedule you. ° °LAPAROSCOPIC SURGERY: POST OP INSTRUCTIONS  °1. DIET: Follow a light bland diet the first 24 hours after arrival home, such as soup, liquids, crackers, etc. Be sure to include lots of fluids daily. Avoid fast food or heavy meals as your are more likely to get nauseated. Eat a low fat the next few days after surgery.  °2. Take your usually prescribed home medications unless otherwise directed. °3. PAIN CONTROL:  °1. Pain is best controlled by a usual combination of three different methods TOGETHER:  °1. Ice/Heat °2. Over the counter pain medication °3. Prescription pain medication °2. Most patients will experience some swelling and bruising around the incisions. Ice packs or heating pads (30-60 minutes up to 6 times a day) will help. Use ice for the first few days to help decrease swelling and bruising, then switch to heat to help relax tight/sore spots and speed recovery. Some people prefer to use ice alone, heat alone, alternating between ice & heat. Experiment to what works for you. Swelling and bruising can take several weeks to resolve.  °3. It is helpful to take an over-the-counter pain medication regularly for the first few weeks. Choose one of the following that works best for you:  °1. Naproxen (Aleve, etc) Two 220mg tabs twice a day °2. Ibuprofen (Advil, etc) Three 200mg tabs four times a day (every meal & bedtime) °3. Acetaminophen (Tylenol, etc) 500-650mg four times a day (every meal & bedtime) °4. A prescription for pain medication (such as oxycodone, hydrocodone, etc) should be given to you upon discharge. Take your pain medication as prescribed.  °1. If you are having problems/concerns with the prescription medicine (does not control pain, nausea, vomiting, rash, itching, etc), please call us [phone]  [phone] to see if we need to switch you to a different pain medicine that will work better for you and/or control your side effect better. °2. If you need a refill on your pain medication, please contact your pharmacy. They will contact our office to request authorization. Prescriptions will not be filled after 5 pm or on week-ends. °4. Avoid getting constipated. Between the surgery and the pain medications, it is common to experience some constipation. Increasing fluid intake and taking a fiber supplement (such as Metamucil, Citrucel, FiberCon, MiraLax, etc) 1-2 times a day regularly will usually help prevent this problem from occurring. A mild laxative (prune juice, Milk of Magnesia, MiraLax, etc) should be taken according to package directions if there are no bowel movements after 48 hours.  °5. Watch out for diarrhea. If you have many loose bowel movements, simplify your diet to bland foods & liquids for a few days. Stop any stool softeners and decrease your fiber supplement. Switching to mild anti-diarrheal medications (Kayopectate, Pepto Bismol) can help. If this worsens or does not improve, please call us. °6. Wash / shower every day. You may shower over the dressings as they are waterproof. Continue to shower over incision(s) after the dressing is off. °7. Remove your waterproof bandages 5 days after surgery. You may leave the incision open to air. You may replace a dressing/Band-Aid to cover the incision for comfort if you wish.  °8. ACTIVITIES as tolerated:  °1. You may resume regular (light) daily activities beginning the next day--such as daily self-care, walking, climbing stairs--gradually   increasing activities as tolerated. If you can walk 30 minutes without difficulty, it is safe to try more intense activity such as jogging, treadmill, bicycling, low-impact aerobics, swimming, etc. °2. Save the most intensive and strenuous activity for last such as sit-ups, heavy lifting, contact sports, etc Refrain  from any heavy lifting or straining until you are off narcotics for pain control.  °3. DO NOT PUSH THROUGH PAIN. Let pain be your guide: If it hurts to do something, don't do it. Pain is your body warning you to avoid that activity for another week until the pain goes down. °4. You may drive when you are no longer taking prescription pain medication, you can comfortably wear a seatbelt, and you can safely maneuver your car and apply brakes. °5. You may have sexual intercourse when it is comfortable.  °9. FOLLOW UP in our office  °1. Please call [facility] at [phone] to set up an appointment to see your surgeon in the office for a follow-up appointment approximately 2-3 weeks after your surgery. °2. Make sure that you call for this appointment the day you arrive home to insure a convenient appointment time. °     10. IF YOU HAVE DISABILITY OR FAMILY LEAVE FORMS, BRING THEM TO THE               OFFICE FOR PROCESSING.  ° °WHEN TO CALL US [phone]:  °1. Poor pain control °2. Reactions / problems with new medications (rash/itching, nausea, etc)  °3. Fever over 101.5 F (38.5 C) °4. Inability to urinate °5. Nausea and/or vomiting °6. Worsening swelling or bruising °7. Continued bleeding from incision. °8. Increased pain, redness, or drainage from the incision ° °The clinic staff is available to answer your questions during regular business hours (8:30am-5pm). Please don’t hesitate to call and ask to speak to one of our nurses for clinical concerns.  °If you have a medical emergency, go to the nearest emergency room or call 911.  °A surgeon from [facility] is always on call at the hospitals  ° °[facility]  °[address], [address], [place], [place] [address] ?  °MAIN: [phone] ? TOLL FREE: [phone] ?  °FAX [phone]  °[email or web address] ° °

## 2017-03-02 NOTE — Progress Notes (Signed)
1 Day Post-Op  Subjective: Alert and stable.  Feels much better and wants to go home Tolerating liquid diet.  Ambulating to bathroom.  Voiding well. Operative findings and events discussed with patient in detail  Lab work shows LFTs improved with total bilirubin 1.2, normal.  WBC 17,400.  1113.7.   Objective: Vital signs in last 24 hours: Temp:  [97.9 F (36.6 C)-99.1 F (37.3 C)] 98.3 F (36.8 C) (06/01 0553) Pulse Rate:  [81-99] 85 (06/01 0553) Resp:  [14-20] 20 (06/01 0553) BP: (131-158)/(80-93) 149/92 (06/01 0553) SpO2:  [90 %-98 %] 94 % (06/01 0553) Last BM Date: 02/27/17  Intake/Output from previous day: 05/31 0701 - 06/01 0700 In: 3291.7 [P.O.:960; I.V.:2331.7] Out: 154 [Urine:4; Blood:150] Intake/Output this shift: Total I/O In: 960 [P.O.:960] Out: 4 [Urine:4]  General appearance: Alert.  Mental status normal.  Friendly.  No distress Resp: clear to auscultation bilaterally GI: Soft.  Nondistended.  Trocar sites look good.  Minimal tenderness. Extremities: extremities normal, atraumatic, no cyanosis or edema  Lab Results:  Results for orders placed or performed during the hospital encounter of 02/28/17 (from the past 24 hour(s))  Comprehensive metabolic panel     Status: Abnormal   Collection Time: 03/02/17  4:27 AM  Result Value Ref Range   Sodium 137 135 - 145 mmol/L   Potassium 4.0 3.5 - 5.1 mmol/L   Chloride 102 101 - 111 mmol/L   CO2 27 22 - 32 mmol/L   Glucose, Bld 133 (H) 65 - 99 mg/dL   BUN 13 6 - 20 mg/dL   Creatinine, Ser 0.79 0.61 - 1.24 mg/dL   Calcium 8.6 (L) 8.9 - 10.3 mg/dL   Total Protein 6.3 (L) 6.5 - 8.1 g/dL   Albumin 3.2 (L) 3.5 - 5.0 g/dL   AST 84 (H) 15 - 41 U/L   ALT 152 (H) 17 - 63 U/L   Alkaline Phosphatase 57 38 - 126 U/L   Total Bilirubin 1.2 0.3 - 1.2 mg/dL   GFR calc non Af Amer >60 >60 mL/min   GFR calc Af Amer >60 >60 mL/min   Anion gap 8 5 - 15  CBC     Status: Abnormal   Collection Time: 03/02/17  4:27 AM  Result Value  Ref Range   WBC 17.4 (H) 4.0 - 10.5 K/uL   RBC 4.62 4.22 - 5.81 MIL/uL   Hemoglobin 13.7 13.0 - 17.0 g/dL   HCT 41.3 39.0 - 52.0 %   MCV 89.4 78.0 - 100.0 fL   MCH 29.7 26.0 - 34.0 pg   MCHC 33.2 30.0 - 36.0 g/dL   RDW 13.0 11.5 - 15.5 %   Platelets 239 150 - 400 K/uL     Studies/Results: Dg Cholangiogram Operative  Result Date: 03/01/2017 CLINICAL DATA:  66 year old male with a history of cholelithiasis EXAM: INTRAOPERATIVE CHOLANGIOGRAM TECHNIQUE: Cholangiographic images from the C-arm fluoroscopic device were submitted for interpretation post-operatively. Please see the procedural report for the amount of contrast and the fluoroscopy time utilized. COMPARISON:  Ultrasound 02/28/2017 FINDINGS: Surgical instruments project over the upper abdomen. There is cannulation of the cystic duct/gallbladder neck, with antegrade infusion of contrast. Caliber of the extrahepatic ductal system within normal limits. No large filling defect identified. Free flow of contrast across the ampulla. IMPRESSION: Intraoperative cholangiogram demonstrates extrahepatic biliary ducts of unremarkable caliber, with no large filling defect identified. Free flow of contrast across the ampulla. Please refer to the dictated operative report for full details of intraoperative findings and procedure  Electronically Signed   By: Corrie Mckusick D.O.   On: 03/01/2017 14:04    . enoxaparin (LOVENOX) injection  40 mg Subcutaneous Q24H  . pantoprazole  40 mg Oral Daily  . tamsulosin  0.4 mg Oral QPC supper     Assessment/Plan: s/p Procedure(s): LAPAROSCOPIC CHOLECYSTECTOMY WITH INTRAOPERATIVE CHOLANGIOGRAM  POD #1.  Laparoscopic cholecystectomy with intraoperative cholangiogram  Somewhat difficult case with spillage of stones which appear to have been completely retrieved  Cholangiogram normal   Clinically he is doing very well and can go home today if he tolerates breakfast  Follow-up in office in 3 weeks  Will return  later this morning after breakfast to make final decisions about discharge   @PROBHOSP @  LOS: 0 days    Ivori Storr M 03/02/2017  . .prob

## 2018-03-08 IMAGING — US US ABDOMEN LIMITED
1 series · 14 of 25 positions shown · non-contrast
Comparison: None.

CLINICAL DATA: Right upper quadrant pain

EXAM:
US ABDOMEN LIMITED - RIGHT UPPER QUADRANT

[Series 1: us abdomen limited · 0.22mm/px · 14 of 39 slices shown]
[im 1/39]
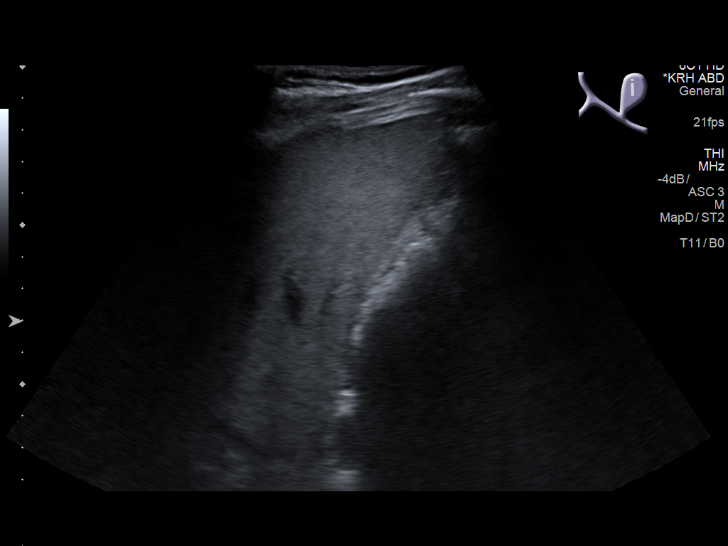
[im 4/39]
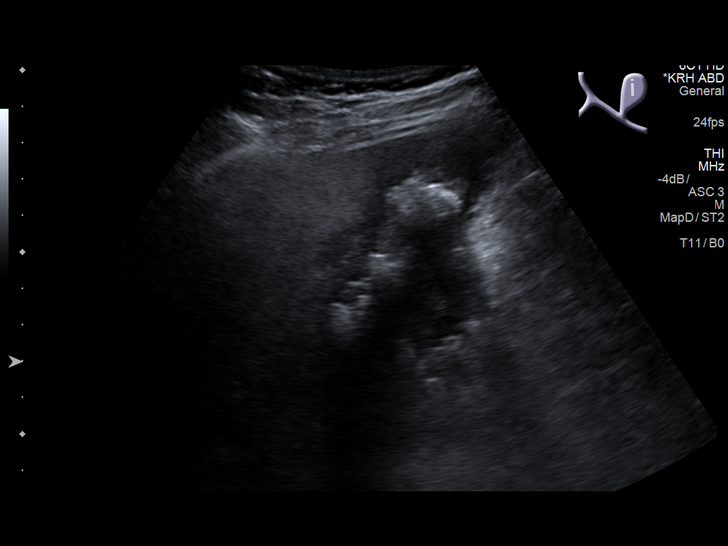
[im 7/39]
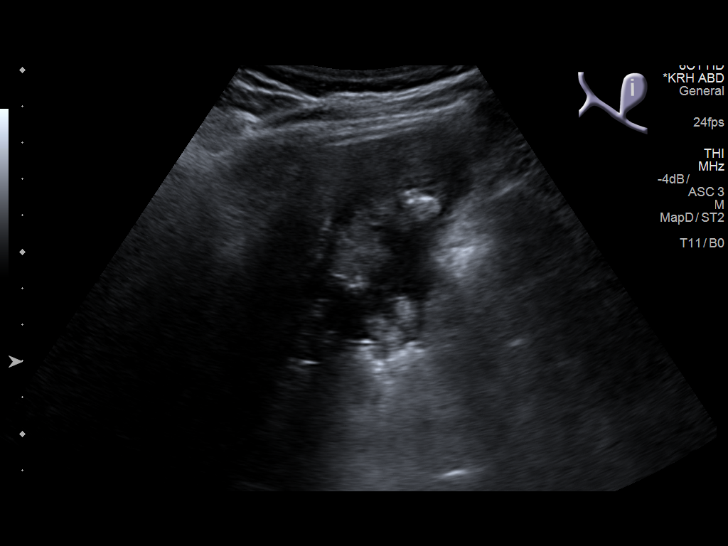
[im 10/39]
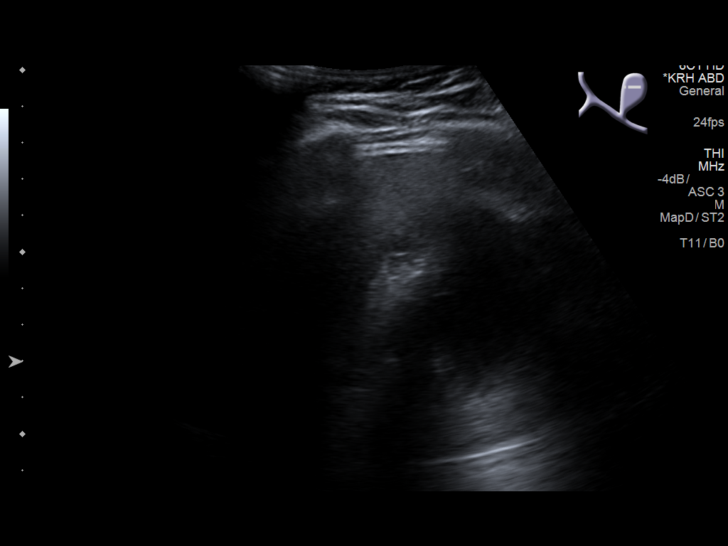
[im 13/39]
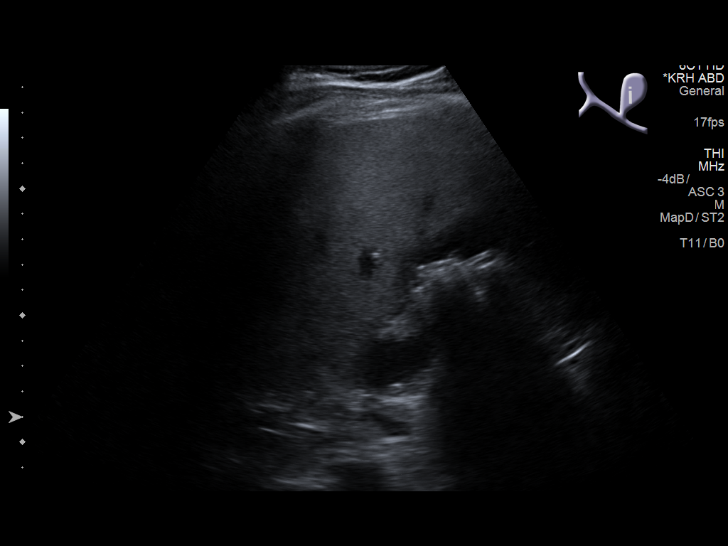
[im 15/39]
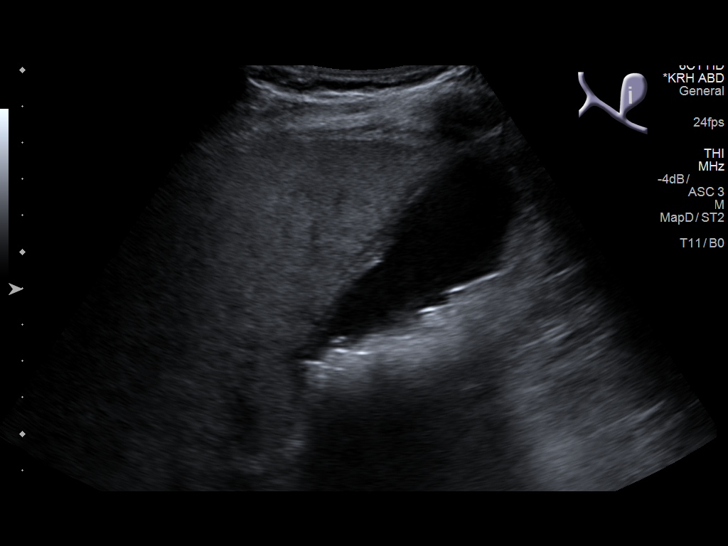
[im 18/39]
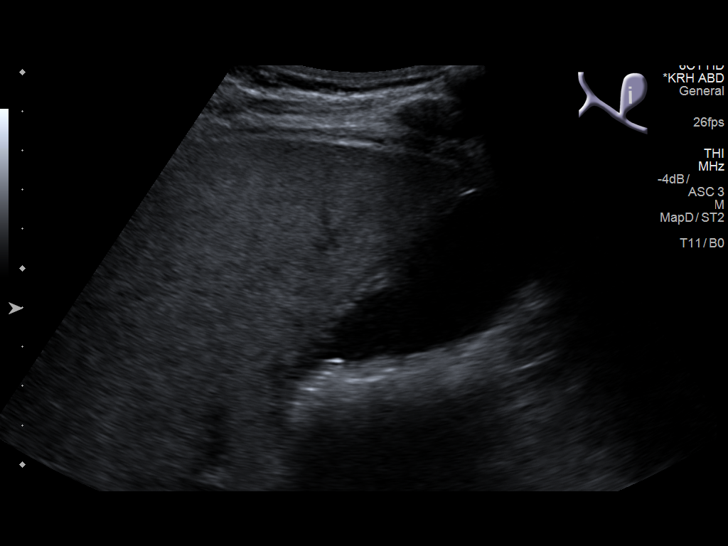
[im 21/39]
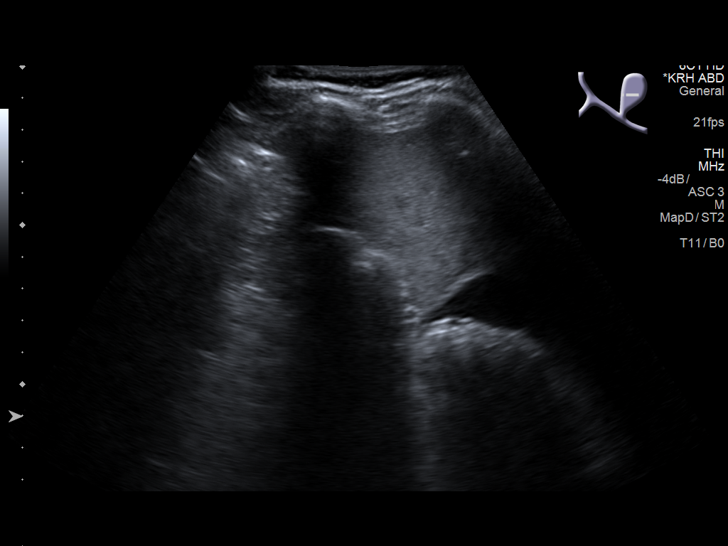
[im 24/39]
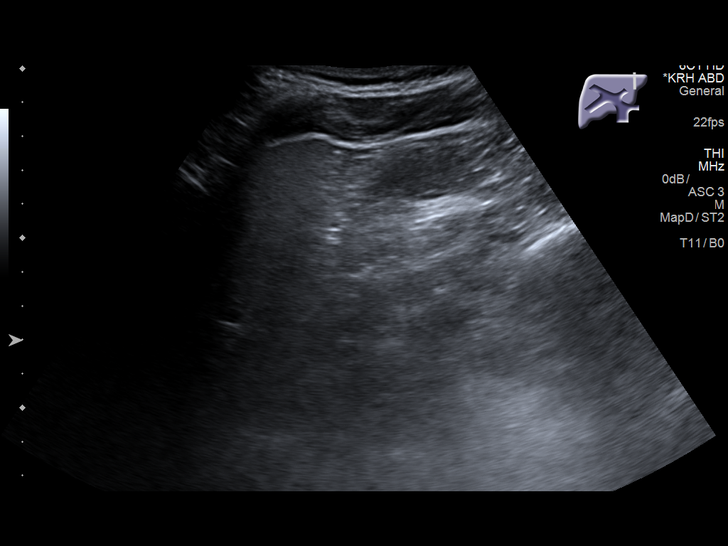
[im 26/39]
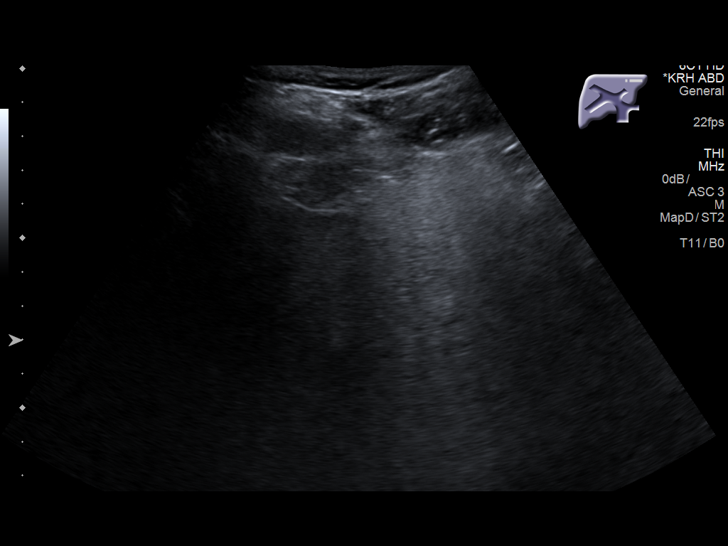
[im 29/39]
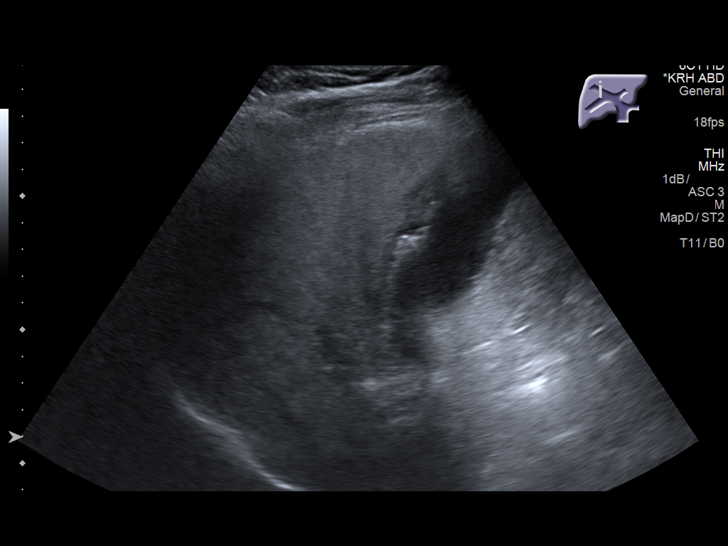
[im 32/39]
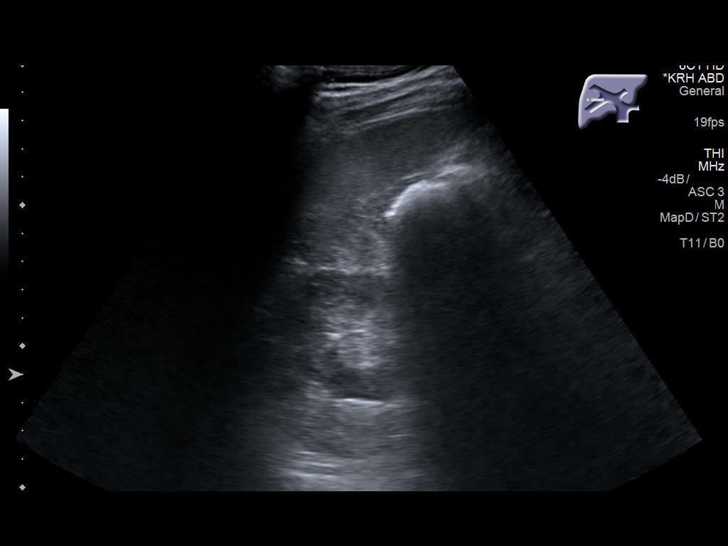
[im 35/39]
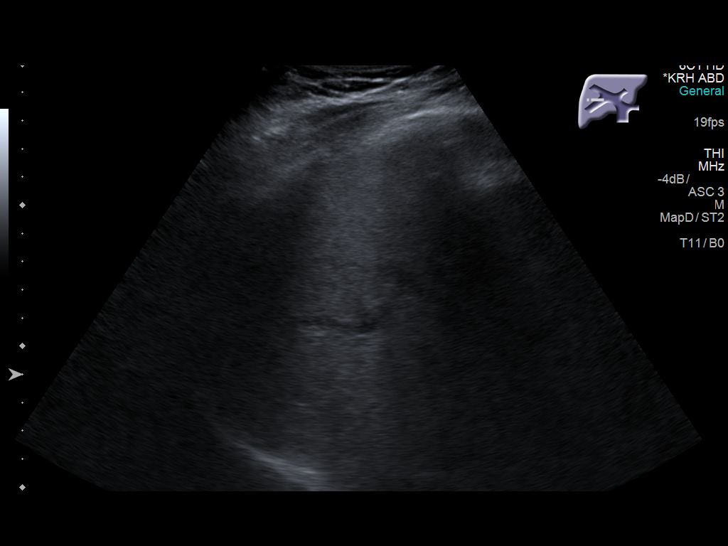
[im 39/39]
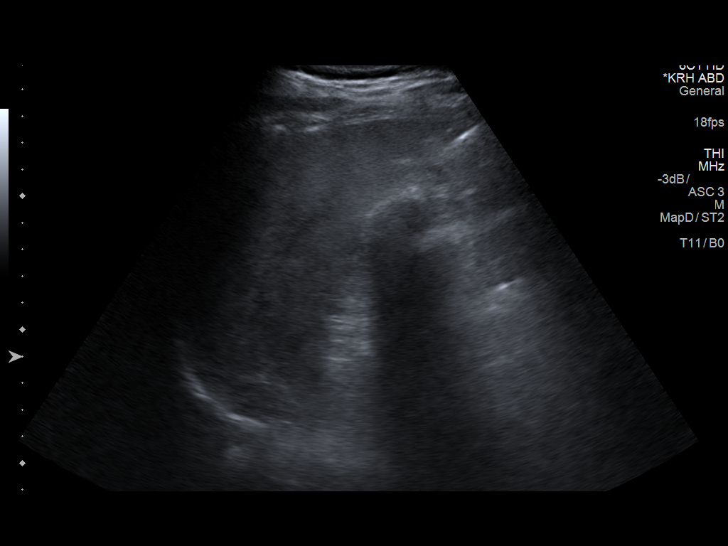

[14 of 25 positions shown; findings below may reference images not displayed]

FINDINGS: Gallbladder:

Well distended with multiple gallstones within. Gallbladder wall
thickening is noted.

Common bile duct:

Poorly visualized

Liver:

No focal lesion identified. Within normal limits in parenchymal
echogenicity.
IMPRESSION: Multiple gallstones with gallbladder wall thickening. In the
appropriate clinical setting this would be consistent with acute
cholecystitis.

## 2018-09-06 ENCOUNTER — Telehealth: Payer: Self-pay | Admitting: General Practice

## 2018-09-06 NOTE — Telephone Encounter (Signed)
Copied from [place] [id number]. Topic: Quick Communication - See Telephone Encounter >> Sep 06, 2018  2:18 PM Genella Rife H wrote: CRM for notification. See Telephone encounter for: 09/06/18.  Left message to call us back to schedule new patient appt

## 2018-09-30 ENCOUNTER — Encounter: Payer: Self-pay | Admitting: Family Medicine

## 2018-09-30 ENCOUNTER — Other Ambulatory Visit: Payer: Self-pay | Admitting: Family Medicine

## 2018-09-30 ENCOUNTER — Ambulatory Visit (HOSPITAL_BASED_OUTPATIENT_CLINIC_OR_DEPARTMENT_OTHER)
Admission: RE | Admit: 2018-09-30 | Discharge: 2018-09-30 | Disposition: A | Discharge status: Home / Self Care | Payer: Medicare PPO | Source: Ambulatory Visit | Attending: Family Medicine | Admitting: Family Medicine

## 2018-09-30 ENCOUNTER — Ambulatory Visit (INDEPENDENT_AMBULATORY_CARE_PROVIDER_SITE_OTHER): Payer: Commercial Managed Care - PPO | Admitting: Family Medicine

## 2018-09-30 VITALS — BP 118/78 | HR 95 | Temp 98.1°F | Ht 63.0 in | Wt 180.0 lb

## 2018-09-30 DIAGNOSIS — Z23 Encounter for immunization: Secondary | ICD-10-CM | POA: Diagnosis not present

## 2018-09-30 DIAGNOSIS — R0609 Other forms of dyspnea: Secondary | ICD-10-CM | POA: Insufficient documentation

## 2018-09-30 DIAGNOSIS — R0602 Shortness of breath: Secondary | ICD-10-CM

## 2018-09-30 LAB — BASIC METABOLIC PANEL
BUN: 16 mg/dL (ref 6–23)
CHLORIDE: 99 meq/L (ref 96–112)
CO2: 30 mEq/L (ref 19–32)
CREATININE: 0.86 mg/dL (ref 0.40–1.50)
Calcium: 9.5 mg/dL (ref 8.4–10.5)
GFR: 94.39 mL/min (ref >60.00)
Glucose, Bld: 104 mg/dL — ABNORMAL HIGH (ref 70–99)
Potassium: 4 mEq/L (ref 3.5–5.1)
Sodium: 138 mEq/L (ref 135–145)

## 2018-09-30 LAB — CBC
HEMATOCRIT: 47.2 % (ref 39.0–52.0)
HEMOGLOBIN: 16 g/dL (ref 13.0–17.0)
MCHC: 33.8 g/dL (ref 30.0–36.0)
MCV: 89 fl (ref 78.0–100.0)
PLATELETS: 288 10*3/uL (ref 150.0–400.0)
RBC: 5.31 Mil/uL (ref 4.22–5.81)
RDW: 13 % (ref 11.5–15.5)
WBC: 6.9 10*3/uL (ref 4.0–10.5)

## 2018-09-30 LAB — MAGNESIUM: MAGNESIUM: 2.1 mg/dL (ref 1.5–2.5)

## 2018-09-30 MED ORDER — ALBUTEROL SULFATE HFA 108 (90 BASE) MCG/ACT IN AERS: 1.0000 | INHALATION_SPRAY | Freq: Four times a day (QID) | RESPIRATORY_TRACT | 1 refills | Status: DC | PRN

## 2018-09-30 NOTE — Addendum Note (Signed)
Addended by: Sharon Seller B on: 09/30/2018 11:35 AM   Modules accepted: Orders

## 2018-09-30 NOTE — Progress Notes (Signed)
Chief Complaint  Patient presents with  . New Patient (Initial Visit)       New Patient Visit SUBJECTIVE: HPI: John Zamora is an 67 y.o.male who is being seen for establishing care.  The patient has not had a PCP in quite some time.   SOB 6-7 yrs ago, started to get worsening DOE. This has progressively gotten worse. Gets better within a min. No coughing or wheezing. Will sometimes get sob when he lays flat. No LE edema. Dad smoked, but pt never did. Does snore and gasp for air. No unexpected wt changes, no heart hx. He denies current CP or SOB. Has not tried anything for this. No hx of renal failure, anemia, bleeding, allergies.   Allergies  Allergen Reactions  . Ceftriaxone Rash    Past Medical History:  Diagnosis Date  . Hemorrhoids    Past Surgical History:  Procedure Laterality Date  . ACHILLES TENDON REPAIR Right   . CHOLECYSTECTOMY N/A 03/01/2017   Procedure: LAPAROSCOPIC CHOLECYSTECTOMY WITH INTRAOPERATIVE CHOLANGIOGRAM;  Surgeon: Fanny Skates, MD;  Location: [facility];  Service: General;  Laterality: N/A;   Family History  Problem Relation Age of Onset  . Alzheimer's disease Mother   . Cancer Father        lung cancer (life long smoker) and pancreatic  . Hyperlipidemia Brother   . Depression Maternal Grandmother    Allergies  Allergen Reactions  . Ceftriaxone Rash    Current Outpatient Medications:  .  loratadine (CLARITIN) 10 MG tablet, Take 10 mg by mouth daily., Disp: , Rfl:  .  Phenyleph-Doxylamine-DM-APAP (NYQUIL SEVERE COLD/FLU) 5-6.25-10-325 MG/15ML LIQD, Take by mouth at bedtime., Disp: , Rfl:  .  Pseudoephedrine-Acetaminophen (ALKA-SELTZER PLUS COLD/SINUS PO), Take by mouth., Disp: , Rfl:  .  saw palmetto 160 MG capsule, Take 160 mg by mouth 2 (two) times daily., Disp: , Rfl:   ROS Cardiovascular: Denies chest pain  Respiratory: Denies cough   OBJECTIVE: BP 118/78 (BP Location: Left Arm, Patient Position: Sitting, Cuff Size: Normal)   Pulse 95    Temp 98.1 F (36.7 C) (Oral)   Ht 5\' 3"  (1.6 m)   Wt 180 lb (81.6 kg)   SpO2 97%   BMI 31.89 kg/m   Constitutional: -  VS reviewed -  Well developed, well nourished, appears stated age -  No apparent distress  Psychiatric: -  Oriented to person, place, and time -  Memory intact -  Affect and mood normal -  Fluent conversation, good eye contact -  Judgment and insight age appropriate  Eye: -  Conjunctivae clear, no discharge -  Pupils symmetric, round, reactive to light  ENMT: -  MMM    Pharynx moist, no exudate, no erythema  Neck: -  No gross swelling, no palpable masses -  Thyroid midline, not enlarged, mobile, no palpable masses  Cardiovascular: -  RRR -  No LE edema  Respiratory: -  Normal respiratory effort, no accessory muscle use, no retraction -  Breath sounds equal, no wheezes, no ronchi, no crackles  Musculoskeletal: -  No clubbing, no cyanosis -  Gait normal  Skin: -  No significant lesion on inspection -  Warm and dry to palpation   ASSESSMENT/PLAN: Dyspnea on exertion - Plan: DG Chest 2 View, CBC, Basic metabolic panel, Magnesium  Ck XR and labs. If neg, will trial SABA.  Patient should return in 1 mo. If still having issues, will set up PFTs.  Update Tdap and PCV23 today. The patient voiced  understanding and agreement to the plan.   [place], DO 09/30/18  11:06 AM

## 2018-09-30 NOTE — Progress Notes (Signed)
Pre visit review using our clinic review tool, if applicable. No additional management support is needed unless otherwise documented below in the visit note. 

## 2018-09-30 NOTE — Patient Instructions (Addendum)
Give [place] 2-3 business days to get the results of your labs back.   1-2 business days to get results of X-ray.  If everything is normal, we will call in an inhaler to your pharmacy.   I don't think you need to change your activity at this time. Do what you can tolerate.  Let us know if you need anything.

## 2018-11-01 ENCOUNTER — Ambulatory Visit: Payer: Medicare PPO | Admitting: Family Medicine

## 2018-11-01 ENCOUNTER — Encounter: Payer: Self-pay | Admitting: Family Medicine

## 2018-11-01 VITALS — BP 126/76 | HR 72 | Resp 16 | Wt 178.0 lb

## 2018-11-01 DIAGNOSIS — L309 Dermatitis, unspecified: Secondary | ICD-10-CM | POA: Diagnosis not present

## 2018-11-01 DIAGNOSIS — R0609 Other forms of dyspnea: Secondary | ICD-10-CM

## 2018-11-01 NOTE — Patient Instructions (Addendum)
Continue the inhaler for now. Let me know if you need refills.   We can always add other inhalers if need be.   For your skin, vaseline or some sort of lotion should fix it. For the finger, consider hydrocortisone cream to calm down irritation.  Let us know if you need anything.

## 2018-11-01 NOTE — Progress Notes (Signed)
CC: f/u breathing Subjective: Patient is a 68 y.o. male here for follow-up dyspnea on exertion.  This has been a longstanding issue.  He was trialed on a short acting beta agonist that did seem to improve his breathing.  He was still short of breath with exertion and wonders if it is just because he is getting older and deconditioned.  He also uses before bed because it helps with his snoring.  No witnessed apneic episodes after bringing this up with his wife.  He also has scaling on his right eyelid and left ring finger.  He has not tried anything on this so far.  No new topicals.  No pain or drainage.  ROS: Heart: Denies chest pain  Lungs: +DOE   Past Medical History:  Diagnosis Date  . Hemorrhoids     Objective: BP 126/76 (BP Location: Left Arm, Patient Position: Sitting, Cuff Size: Normal)   Pulse 72   Resp 16   Wt 178 lb (80.7 kg)   SpO2 97%   BMI 31.53 kg/m  General: Awake, appears stated age HEENT: MMM, EOMi Heart: RRR, no murmurs Lungs: CTAB, no rales, wheezes or rhonchi. No accessory muscle use Skin: Thickened area with unroofing of main skin layer on the dorsum of fourth digit proximal phalanx; there is some scaling, some scaly skin that is hyperkeratotic over the left medial upper eyelid. Psych: Age appropriate judgment and insight, normal affect and mood  Assessment and Plan: Dyspnea on exertion  Dermatitis  Continue short acting beta agonist.  Offered inhaled corticosteroid but he declined at this time.  He would like to increase his physical activity and stamina.  I will plan to see him in 5 months as he does have an active summer plan.  This will put him 1 month into activity then.  Vaseline for the scaly skin.  Can use hydrocortisone cream on the finger as needed. Follow-up as needed prior to this. The patient voiced understanding and agreement to the plan.  [place], DO 11/01/18  3:52 PM

## 2018-11-18 ENCOUNTER — Ambulatory Visit: Payer: Commercial Managed Care - PPO | Admitting: Family Medicine

## 2018-11-28 ENCOUNTER — Ambulatory Visit (INDEPENDENT_AMBULATORY_CARE_PROVIDER_SITE_OTHER): Payer: Commercial Managed Care - PPO | Admitting: Family Medicine

## 2018-11-28 ENCOUNTER — Encounter: Payer: Self-pay | Admitting: Family Medicine

## 2018-11-28 VITALS — BP 108/78 | HR 79 | Temp 98.1°F | Ht 63.0 in | Wt 179.0 lb

## 2018-11-28 DIAGNOSIS — H1033 Unspecified acute conjunctivitis, bilateral: Secondary | ICD-10-CM

## 2018-11-28 MED ORDER — ERYTHROMYCIN 5 MG/GM OP OINT: 1.0000 "application " | TOPICAL_OINTMENT | Freq: Every day | OPHTHALMIC | 0 refills | Status: DC

## 2018-11-28 NOTE — Patient Instructions (Addendum)
Artificial tears like Refresh and Systane may be used for comfort. OK to get generic version. Generally people use them every 2-4 hours, but you can use them as much as you want because there is no medication in it.  Try not to touch your face. Do not share towels.  Continue Vaseline as needed. You can use this as much as you want. I would use it more than daily.  OTC steroid cream (hydrocortisone 1%) twice daily for 10 days in addition to Vaseline use.   Avoid scented products as this can irritate your skin.  Send me a message if you are not getting better.  Let us know if you need anything.

## 2018-11-28 NOTE — Progress Notes (Signed)
Chief Complaint  Patient presents with  . Eye Problem    right eye and eye lid    John Zamora is here for bilateral eye irritation.  Duration: 4 weeks Chemical exposure? No  Recent URI? No  Contact lenses? No  History of allergies? No  Treatment to date: hot compresses, eye wipes; Vaseline on eyes that has helped with scaling/itching  ROS:  Eyes: As noted above  Past Medical History:  Diagnosis Date  . Hemorrhoids    BP 108/78 (BP Location: Left Arm, Patient Position: Sitting, Cuff Size: Normal)   Pulse 79   Temp 98.1 F (36.7 C) (Oral)   Ht 5\' 3"  (1.6 m)   Wt 179 lb (81.2 kg)   SpO2 97%   BMI 31.71 kg/m  Gen: Awake, alert, appears stated age Eyes: Lids neg, Sclera minimally injected, no drainage, PERRLA, EOMi  Nose: Nares patent without discharge Mouth: MMM Skin: Scaly skin over b/l eyelids, no erythema, drainage, exudate, fluctuance Psych: Age appropriate judgment and insight; mood and affect normal  Acute conjunctivitis of both eyes, unspecified acute conjunctivitis type - Plan: erythromycin ophthalmic ointment  Orders as above. Instructed to practice good hand hygiene and try not to touch face. HC 1% OTC bid for 10 d. Vaseline prn, at least twice daily.  Warm compresses and artificial tears also recommended. F/u if no improvement in 7-10 days. Pt voiced understanding and agreement to the plan.  [place], DO 11/28/18 4:09 PM

## 2018-12-15 ENCOUNTER — Encounter: Payer: Self-pay | Admitting: Family Medicine

## 2019-03-06 ENCOUNTER — Encounter: Payer: Self-pay | Admitting: Family Medicine

## 2019-04-02 ENCOUNTER — Other Ambulatory Visit: Payer: Self-pay

## 2019-04-02 ENCOUNTER — Ambulatory Visit (INDEPENDENT_AMBULATORY_CARE_PROVIDER_SITE_OTHER): Payer: Commercial Managed Care - PPO | Admitting: Family Medicine

## 2019-04-02 ENCOUNTER — Encounter: Payer: Self-pay | Admitting: Family Medicine

## 2019-04-02 VITALS — BP 113/86 | HR 72 | Temp 98.2°F | Resp 18 | Ht 63.0 in | Wt 175.6 lb

## 2019-04-02 DIAGNOSIS — Z09 Encounter for follow-up examination after completed treatment for conditions other than malignant neoplasm: Secondary | ICD-10-CM

## 2019-04-02 DIAGNOSIS — R0609 Other forms of dyspnea: Secondary | ICD-10-CM | POA: Diagnosis not present

## 2019-04-02 IMAGING — RF DG CHOLANGIOGRAM OPERATIVE
1 series · 4 of 4 positions shown · non-contrast
Comparison: Ultrasound 02/28/2017

CLINICAL DATA: 66-year-old male with a history of cholelithiasis

EXAM:
INTRAOPERATIVE CHOLANGIOGRAM
TECHNIQUE: Cholangiographic images from the C-arm fluoroscopic device were
submitted for interpretation post-operatively. Please see the
procedural report for the amount of contrast and the fluoroscopy
time utilized.

[Series 1: run · 4 of 108 frames shown]
[frame 17/108]
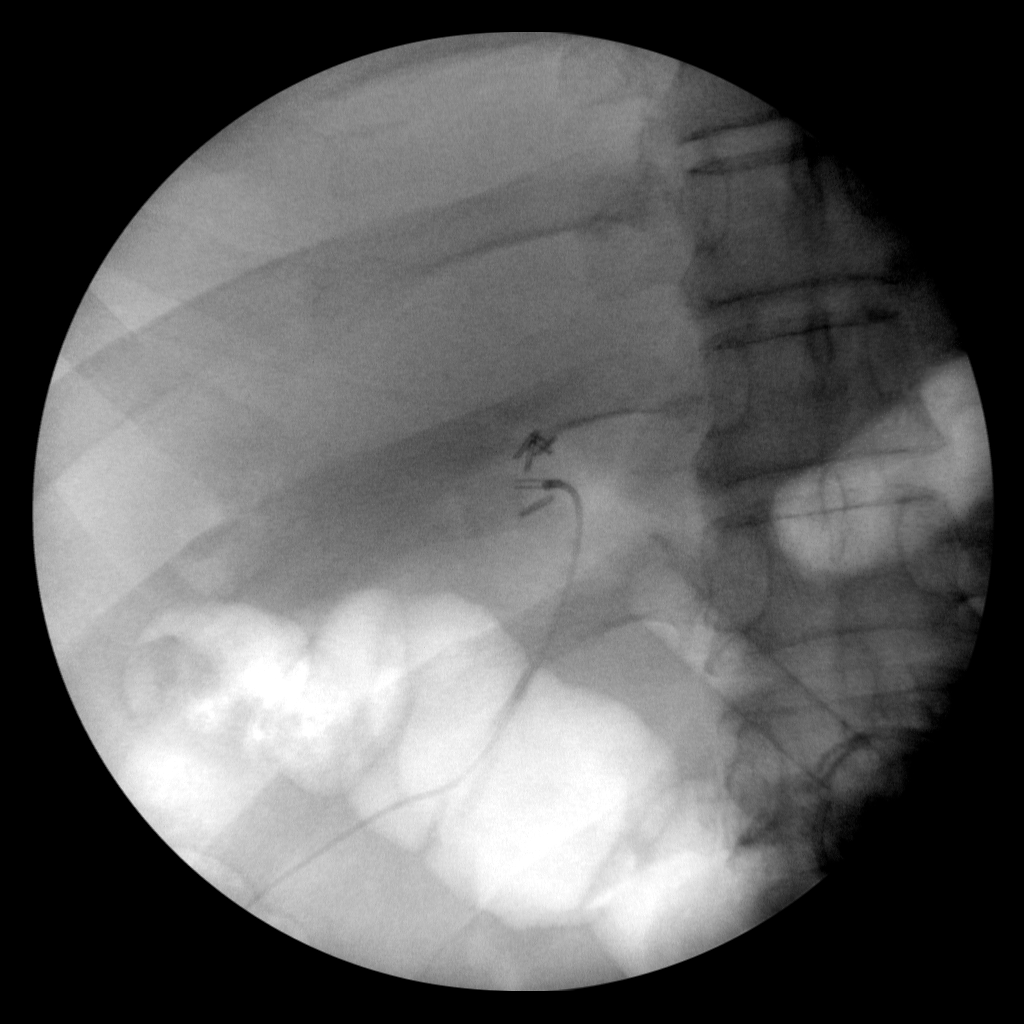
[frame 54/108]
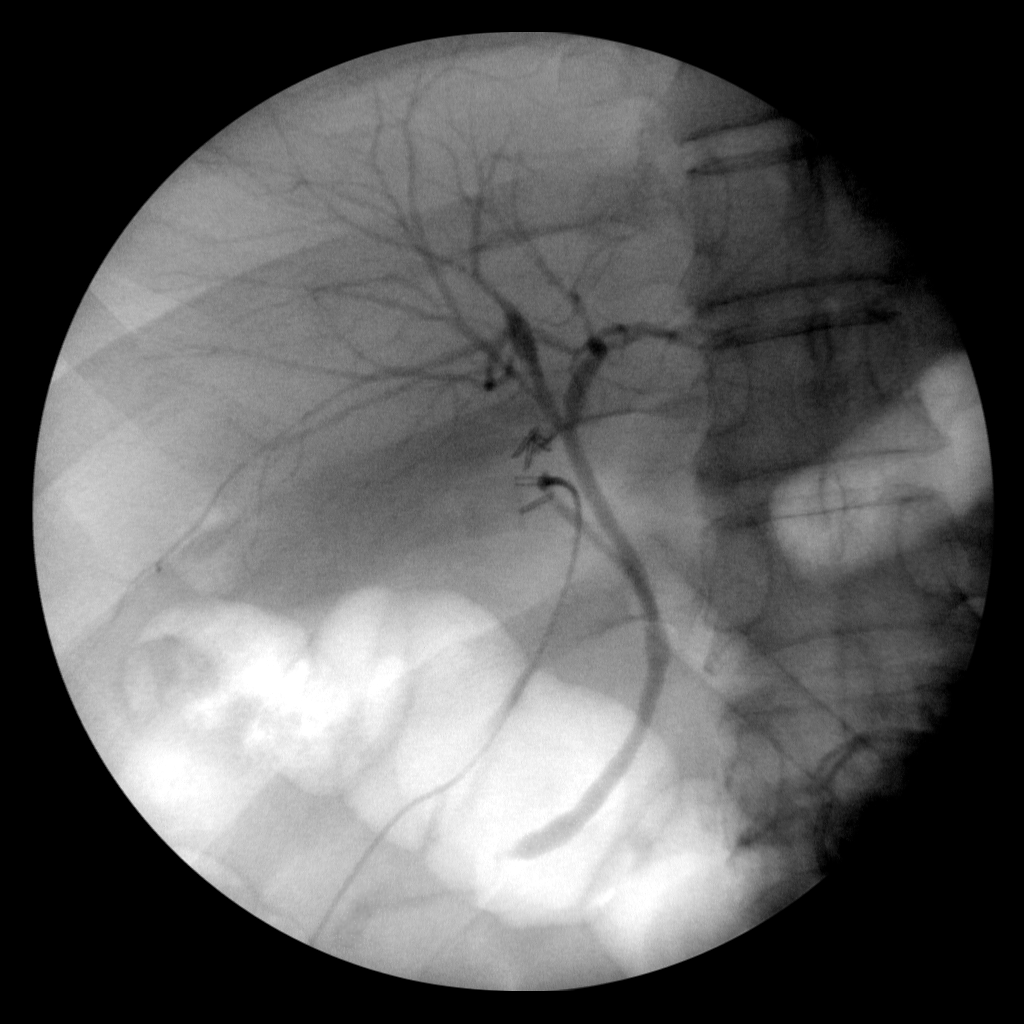
[frame 55/108]
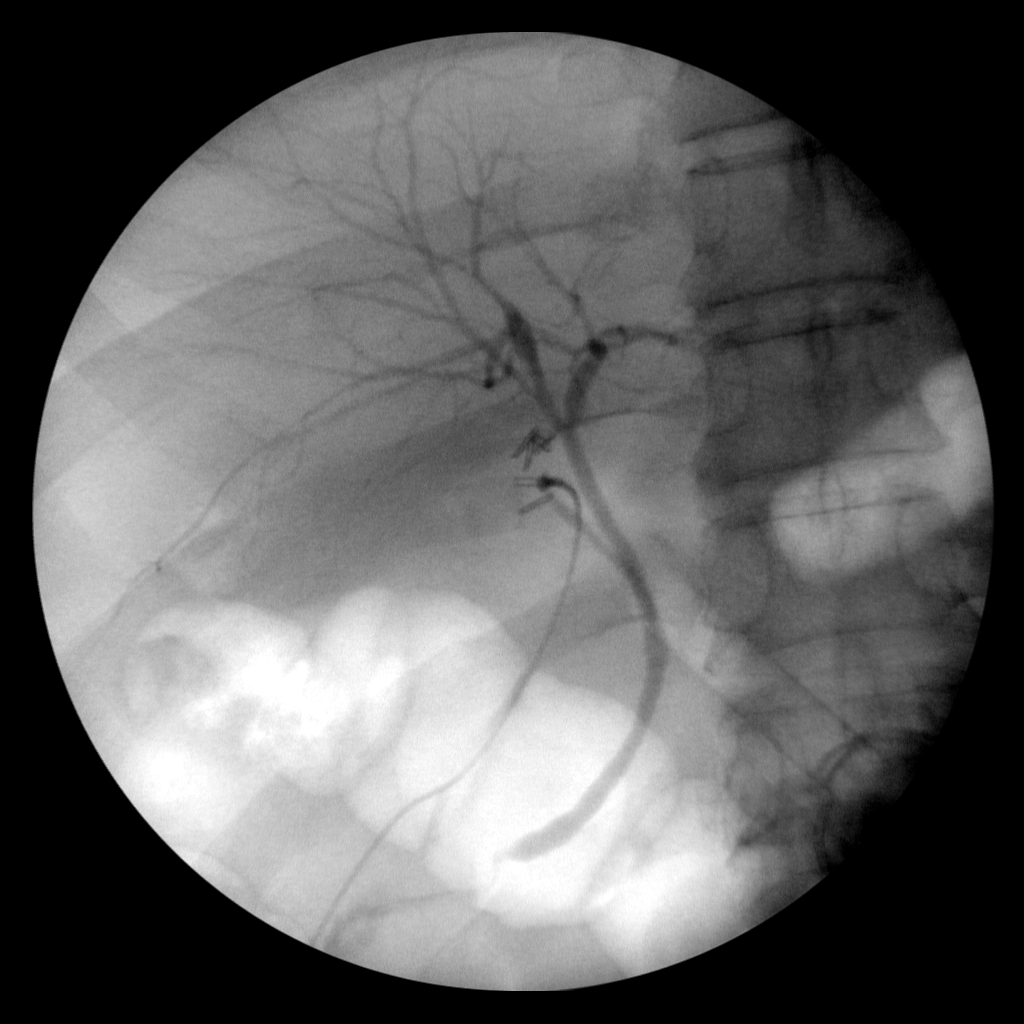
[frame 92/108]
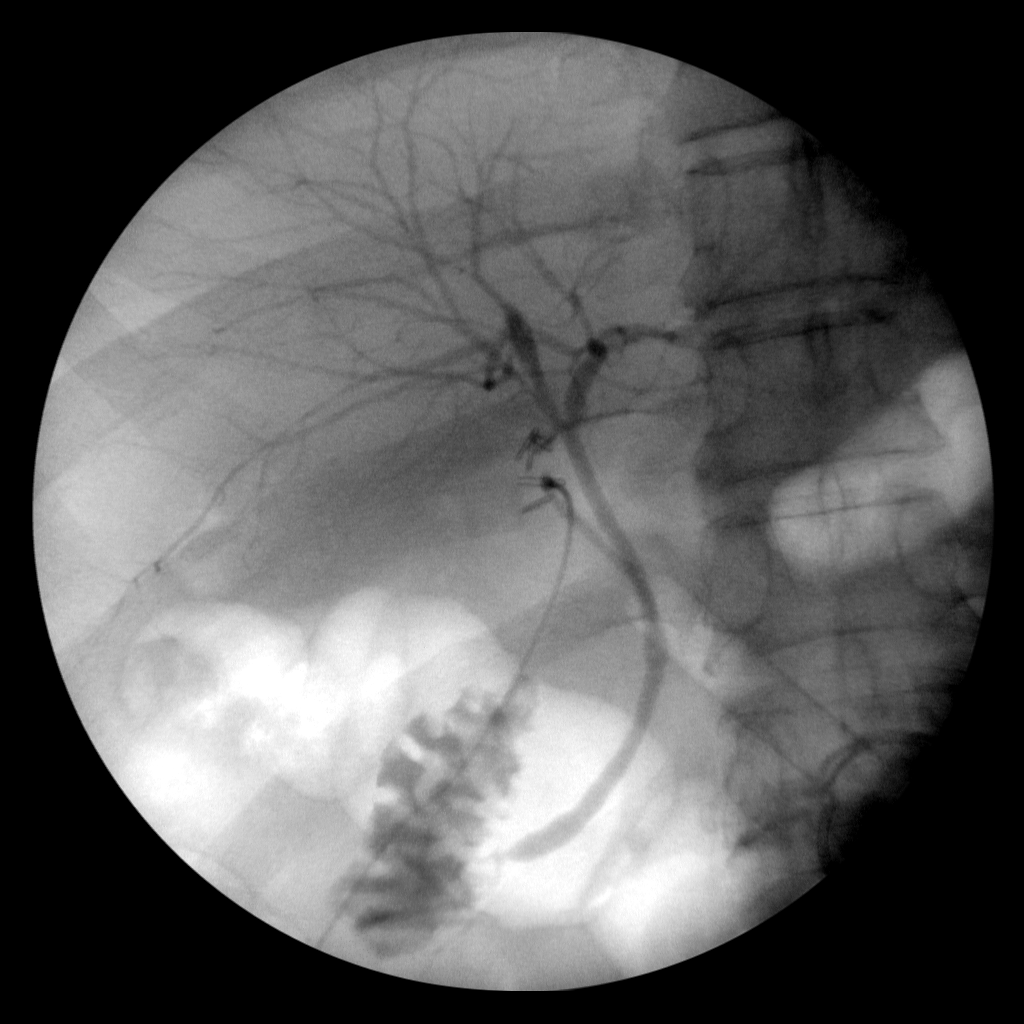

[4 of 4 positions shown; findings below may reference images not displayed]

FINDINGS: Surgical instruments project over the upper abdomen.

There is cannulation of the cystic duct/gallbladder neck, with
antegrade infusion of contrast. Caliber of the extrahepatic ductal
system within normal limits.

No large filling defect identified.

Free flow of contrast across the ampulla.
IMPRESSION: Intraoperative cholangiogram demonstrates extrahepatic biliary ducts
of unremarkable caliber, with no large filling defect identified.
Free flow of contrast across the ampulla.

Please refer to the dictated operative report for full details of
intraoperative findings and procedure

## 2019-04-02 NOTE — Progress Notes (Signed)
Chief Complaint  Patient presents with  . Shortness of Breath    Pt states doing fine and states he thinks SOB was related to nerves.  . Follow-up    Subjective: Patient is a 68 y.o. male here for f/u.  DOE is about the same. Lately has only gotten when he goes up 2 flights of stairs, has sob and gets better after around 1 min. Thinks it might be more his age and lack of physical activity rather than a lung issue. CXR has been neg. SABA not helpful when he stopped. Lab workup neg.   Had a tick bite that has improved.   ROS: Lungs: Denies current SOB   Past Medical History:  Diagnosis Date  . Hemorrhoids     Objective: BP 113/86 (BP Location: Right Arm, Patient Position: Sitting, Cuff Size: Normal)   Pulse 72   Temp 98.2 F (36.8 C) (Oral)   Resp 18   Ht 5\' 3"  (1.6 m)   Wt 175 lb 9.6 oz (79.7 kg)   SpO2 98%   BMI 31.11 kg/m  General: Awake, appears stated age HEENT: MMM Heart: RRR, no bruits, no LE edema Lungs: CTAB, no rales, wheezes or rhonchi. No accessory muscle use Psych: Age appropriate judgment and insight, normal affect and mood  Assessment and Plan: Dyspnea on exertion - Plan: Agree with his decision to not pursue further, counseled on exercise. Standing offer for pulmonologist though.   Follow-up for resolved condition - Plan: Tick bite resolved.   Orders as above. The patient voiced understanding and agreement to the plan.  [place], DO 04/02/19  10:34 AM

## 2019-04-02 NOTE — Patient Instructions (Signed)
Artificial tears like Refresh and Systane may be used for comfort. OK to get generic version. Generally people use them every 2-4 hours, but you can use them as much as you want because there is no medication in it.  Aim to do some physical exertion for 150 minutes per week. This is typically divided into 5 days per week, 30 minutes per day. The activity should be enough to get your heart rate up. Anything is better than nothing if you have time constraints.  Let us know if you need anything.

## 2019-04-22 ENCOUNTER — Encounter: Payer: Self-pay | Admitting: Family Medicine

## 2019-04-22 ENCOUNTER — Ambulatory Visit (INDEPENDENT_AMBULATORY_CARE_PROVIDER_SITE_OTHER): Payer: Commercial Managed Care - PPO | Admitting: Family Medicine

## 2019-04-22 ENCOUNTER — Other Ambulatory Visit: Payer: Self-pay

## 2019-04-22 VITALS — BP 108/78 | HR 78 | Temp 98.4°F | Ht 63.0 in | Wt 174.0 lb

## 2019-04-22 DIAGNOSIS — D0461 Carcinoma in situ of skin of right upper limb, including shoulder: Secondary | ICD-10-CM

## 2019-04-22 DIAGNOSIS — Z0000 Encounter for general adult medical examination without abnormal findings: Secondary | ICD-10-CM | POA: Diagnosis not present

## 2019-04-22 DIAGNOSIS — D489 Neoplasm of uncertain behavior, unspecified: Secondary | ICD-10-CM

## 2019-04-22 DIAGNOSIS — Z1211 Encounter for screening for malignant neoplasm of colon: Secondary | ICD-10-CM | POA: Diagnosis not present

## 2019-04-22 DIAGNOSIS — Z1159 Encounter for screening for other viral diseases: Secondary | ICD-10-CM | POA: Diagnosis not present

## 2019-04-22 LAB — COMPREHENSIVE METABOLIC PANEL
ALT: 25 U/L (ref 0–53)
AST: 18 U/L (ref 0–37)
Albumin: 4.5 g/dL (ref 3.5–5.2)
Alkaline Phosphatase: 61 U/L (ref 39–117)
BUN: 19 mg/dL (ref 6–23)
CO2: 29 mEq/L (ref 19–32)
Calcium: 9.5 mg/dL (ref 8.4–10.5)
Chloride: 101 mEq/L (ref 96–112)
Creatinine, Ser: 0.88 mg/dL (ref 0.40–1.50)
GFR: 86.34 mL/min (ref >60.00)
Glucose, Bld: 86 mg/dL (ref 70–99)
Potassium: 4.5 mEq/L (ref 3.5–5.1)
Sodium: 139 mEq/L (ref 135–145)
Total Bilirubin: 0.7 mg/dL (ref 0.2–1.2)
Total Protein: 7.1 g/dL (ref 6.0–8.3)

## 2019-04-22 LAB — CBC
HCT: 45.7 % (ref 39.0–52.0)
Hemoglobin: 15.5 g/dL (ref 13.0–17.0)
MCHC: 34 g/dL (ref 30.0–36.0)
MCV: 89.5 fl (ref 78.0–100.0)
Platelets: 272 10*3/uL (ref 150.0–400.0)
RBC: 5.11 Mil/uL (ref 4.22–5.81)
RDW: 12.9 % (ref 11.5–15.5)
WBC: 7 10*3/uL (ref 4.0–10.5)

## 2019-04-22 LAB — LIPID PANEL
Cholesterol: 263 mg/dL — ABNORMAL HIGH (ref 0–200)
HDL: 39.1 mg/dL (ref >39.00)
NonHDL: 224.36
Total CHOL/HDL Ratio: 7
Triglycerides: 278 mg/dL — ABNORMAL HIGH (ref 0.0–149.0)
VLDL: 55.6 mg/dL — ABNORMAL HIGH (ref 0.0–40.0)

## 2019-04-22 LAB — LDL CHOLESTEROL, DIRECT: Direct LDL: 197 mg/dL

## 2019-04-22 NOTE — Progress Notes (Signed)
Chief Complaint  Patient presents with  . Annual Exam    Well Male John Zamora is here for a complete physical.   His last physical was >1 year ago.  Current diet: in general, a "healthy" diet.   Current exercise: walking, shooting baskets Weight trend: stable Daytime fatigue? No. Seat belt? Yes.    Health maintenance Shingrix- No Colonoscopy- No Tetanus- Yes Hep C- No Pneumonia vaccine- Yes  Past Medical History:  Diagnosis Date  . Hemorrhoids      Past Surgical History:  Procedure Laterality Date  . ACHILLES TENDON REPAIR Right   . CHOLECYSTECTOMY N/A 03/01/2017   Procedure: LAPAROSCOPIC CHOLECYSTECTOMY WITH INTRAOPERATIVE CHOLANGIOGRAM;  Surgeon: Fanny Skates, MD;  Location: [facility];  Service: General;  Laterality: N/A;    Medications  Current Outpatient Medications on File Prior to Visit  Medication Sig Dispense Refill  . Pseudoephedrine-Acetaminophen (ALKA-SELTZER PLUS COLD/SINUS PO) Take by mouth.    . saw palmetto 160 MG capsule Take 160 mg by mouth 2 (two) times daily.     Allergies Allergies  Allergen Reactions  . Ceftriaxone Rash    Family History Family History  Problem Relation Age of Onset  . Alzheimer's disease Mother   . Cancer Father        lung cancer (life long smoker) and pancreatic  . Hyperlipidemia Brother   . Depression Maternal Grandmother     Review of Systems: Constitutional:  no fevers or chills Eye:  no recent significant change in vision Ear/Nose/Mouth/Throat:  Ears:  no recent hearing loss Nose/Mouth/Throat:  no complaints of nasal congestion or sore throat Cardiovascular:  no chest pain, no palpitations Respiratory:  no cough and no shortness of breath Gastrointestinal:  no abdominal pain, no change in bowel habits GU:  Male: negative for dysuria, frequency, and incontinence and negative for prostate symptoms Musculoskeletal/Extremities:  no pain, redness, or swelling of the joints Integumentary (Skin): +lesion on both  hands posteriorly; otherwise no abnormal skin lesions reported Neurologic:  no headaches, Endocrine:  No unexpected weight changes Hematologic/Lymphatic:  no areas of easy bruising  Exam BP 108/78 (BP Location: Left Arm, Patient Position: Standing)   Pulse 78   Temp 98.4 F (36.9 C) (Oral)   Ht 5\' 3"  (1.6 m)   Wt 174 lb (78.9 kg)   SpO2 97%   BMI 30.82 kg/m  General:  well developed, well nourished, in no apparent distress Skin: see below, 0.4x0.6 cm in diameter; otherwise no significant moles, warts, or growths Head:  no masses, lesions, or tenderness Eyes:  pupils equal and round, sclera anicteric without injection Ears:  canals without lesions, TMs shiny without retraction, no obvious effusion, no erythema Nose:  nares patent, septum midline, mucosa normal Throat/Pharynx:  lips and gingiva without lesion; tongue and uvula midline; non-inflamed pharynx; no exudates or postnasal drainage Neck: neck supple without adenopathy, thyromegaly, or masses Lungs:  clear to auscultation, breath sounds equal bilaterally, no respiratory distress Cardio:  regular rate and rhythm, no LE edema or bruits Abdomen:  abdomen soft, nontender; bowel sounds normal; no masses or organomegaly Rectal: Deferred Musculoskeletal:  symmetrical muscle groups noted without atrophy or deformity Extremities:  no clubbing, cyanosis, or edema, no deformities, no skin discoloration Neuro:  gait normal; deep tendon reflexes normal and symmetric Psych: well oriented with normal range of affect and appropriate judgment/insight    Procedure note; shave biopsy Informed consent was obtained. The area, measuring 0.6 cm in diameter, was cleaned with alcohol and injected with 1 mL of  1% lidocaine with epinephrine. A Dermablade was slightly bent and used to cut under the area of interest. The specimen was placed in a sterile specimen cup and sent to the lab. The area was then cauterized ensuring adequate hemostasis. The  area was dressed with triple antibiotic ointment and a bandage. There were no complications noted. The patient tolerated the procedure well.  Assessment and Plan  Well adult exam - Plan: CBC, Comprehensive metabolic panel, Lipid panel  Encounter for hepatitis C screening test for low risk patient - Plan: Hepatitis C antibody  Screen for colon cancer - Plan: Ambulatory referral to Gastroenterology  Neoplasm of uncertain behavior - Plan: Dermatology pathology([place]), PR SHAV SKIN LES 0.6-1.0 CM TRUNK,ARM,LEG  Well 68 y.o. male. Counseled on diet and exercise. Other orders as above. Follow up in 1 yr for CPE pending biopsy results. I fear SCC, but may be AK.  The patient voiced understanding and agreement to the plan.  [place], DO 04/22/19 11:15 AM

## 2019-04-22 NOTE — Patient Instructions (Addendum)
Give [place] 2-3 business days to get the results of your labs back.   Keep the diet clean and stay active.  The new Shingrix vaccine (for shingles) is a 2 shot series. It can make people feel low energy, achy and almost like they have the flu for 48 hours after injection. Please plan accordingly when deciding on when to get this shot. Call our office for a nurse visit appointment to get this. The second shot of the series is less severe regarding the side effects, but it still lasts 48 hours.   Do not shower for the rest of the day. When you do wash it, use only soap and water. Do not vigorously scrub. Apply triple antibiotic ointment (like Neosporin) twice daily. Keep the area clean and dry.   Things to look out for: increasing pain not relieved by ibuprofen/acetaminophen, fevers, spreading redness, drainage of pus, or foul odor.  Give [place] 1 business week to get the results of your biopsy back.   Let us know if you need anything.

## 2019-04-23 LAB — HEPATITIS C ANTIBODY
Hepatitis C Ab: NONREACTIVE
SIGNAL TO CUT-OFF: 0.01 (ref <1.00)

## 2019-04-25 ENCOUNTER — Other Ambulatory Visit: Payer: Self-pay | Admitting: Family Medicine

## 2019-04-25 DIAGNOSIS — E785 Hyperlipidemia, unspecified: Secondary | ICD-10-CM

## 2019-04-30 ENCOUNTER — Other Ambulatory Visit: Payer: Self-pay

## 2019-04-30 ENCOUNTER — Other Ambulatory Visit (INDEPENDENT_AMBULATORY_CARE_PROVIDER_SITE_OTHER): Payer: Commercial Managed Care - PPO

## 2019-04-30 DIAGNOSIS — E785 Hyperlipidemia, unspecified: Secondary | ICD-10-CM

## 2019-04-30 LAB — LIPID PANEL
Cholesterol: 254 mg/dL — ABNORMAL HIGH (ref 0–200)
HDL: 39.8 mg/dL (ref >39.00)
LDL Cholesterol: 179 mg/dL — ABNORMAL HIGH (ref 0–99)
NonHDL: 214.06
Total CHOL/HDL Ratio: 6
Triglycerides: 176 mg/dL — ABNORMAL HIGH (ref 0.0–149.0)
VLDL: 35.2 mg/dL (ref 0.0–40.0)

## 2019-06-26 ENCOUNTER — Encounter: Payer: Self-pay | Admitting: Internal Medicine

## 2020-04-26 ENCOUNTER — Encounter: Payer: Commercial Managed Care - PPO | Admitting: Family Medicine

## 2020-04-27 ENCOUNTER — Ambulatory Visit (INDEPENDENT_AMBULATORY_CARE_PROVIDER_SITE_OTHER): Payer: Medicare PPO | Admitting: Family Medicine

## 2020-04-27 ENCOUNTER — Other Ambulatory Visit (INDEPENDENT_AMBULATORY_CARE_PROVIDER_SITE_OTHER): Payer: Medicare PPO

## 2020-04-27 ENCOUNTER — Encounter: Payer: Self-pay | Admitting: Family Medicine

## 2020-04-27 ENCOUNTER — Other Ambulatory Visit: Payer: Self-pay | Admitting: Family Medicine

## 2020-04-27 ENCOUNTER — Other Ambulatory Visit: Payer: Self-pay

## 2020-04-27 VITALS — BP 110/72 | HR 86 | Temp 98.5°F | Ht 63.0 in | Wt 172.0 lb

## 2020-04-27 DIAGNOSIS — L989 Disorder of the skin and subcutaneous tissue, unspecified: Secondary | ICD-10-CM | POA: Diagnosis not present

## 2020-04-27 DIAGNOSIS — R739 Hyperglycemia, unspecified: Secondary | ICD-10-CM

## 2020-04-27 DIAGNOSIS — Z0000 Encounter for general adult medical examination without abnormal findings: Secondary | ICD-10-CM | POA: Diagnosis not present

## 2020-04-27 DIAGNOSIS — D489 Neoplasm of uncertain behavior, unspecified: Secondary | ICD-10-CM | POA: Diagnosis not present

## 2020-04-27 DIAGNOSIS — Z1211 Encounter for screening for malignant neoplasm of colon: Secondary | ICD-10-CM | POA: Diagnosis not present

## 2020-04-27 DIAGNOSIS — E785 Hyperlipidemia, unspecified: Secondary | ICD-10-CM

## 2020-04-27 LAB — LIPID PANEL
Cholesterol: 228 mg/dL — ABNORMAL HIGH (ref 0–200)
HDL: 38 mg/dL — ABNORMAL LOW (ref >39.00)
NonHDL: 190.11
Total CHOL/HDL Ratio: 6
Triglycerides: 291 mg/dL — ABNORMAL HIGH (ref 0.0–149.0)
VLDL: 58.2 mg/dL — ABNORMAL HIGH (ref 0.0–40.0)

## 2020-04-27 LAB — COMPREHENSIVE METABOLIC PANEL
ALT: 25 U/L (ref 0–53)
AST: 19 U/L (ref 0–37)
Albumin: 4.2 g/dL (ref 3.5–5.2)
Alkaline Phosphatase: 66 U/L (ref 39–117)
BUN: 15 mg/dL (ref 6–23)
CO2: 30 mEq/L (ref 19–32)
Calcium: 9.6 mg/dL (ref 8.4–10.5)
Chloride: 101 mEq/L (ref 96–112)
Creatinine, Ser: 0.77 mg/dL (ref 0.40–1.50)
GFR: 100.41 mL/min (ref >60.00)
Glucose, Bld: 103 mg/dL — ABNORMAL HIGH (ref 70–99)
Potassium: 4.4 mEq/L (ref 3.5–5.1)
Sodium: 136 mEq/L (ref 135–145)
Total Bilirubin: 0.6 mg/dL (ref 0.2–1.2)
Total Protein: 6.9 g/dL (ref 6.0–8.3)

## 2020-04-27 LAB — CBC
HCT: 43.7 % (ref 39.0–52.0)
Hemoglobin: 15 g/dL (ref 13.0–17.0)
MCHC: 34.3 g/dL (ref 30.0–36.0)
MCV: 89 fl (ref 78.0–100.0)
Platelets: 282 10*3/uL (ref 150.0–400.0)
RBC: 4.91 Mil/uL (ref 4.22–5.81)
RDW: 12.9 % (ref 11.5–15.5)
WBC: 7 10*3/uL (ref 4.0–10.5)

## 2020-04-27 LAB — HEMOGLOBIN A1C: Hgb A1c MFr Bld: 5 % (ref 4.6–6.5)

## 2020-04-27 LAB — LDL CHOLESTEROL, DIRECT: Direct LDL: 166 mg/dL

## 2020-04-27 NOTE — Addendum Note (Signed)
Addended by: Sharon Seller B on: 04/27/2020 10:33 AM   Modules accepted: Orders

## 2020-04-27 NOTE — Patient Instructions (Addendum)
Give [place] 2-3 business days to get the results of your labs back.   Keep the diet clean and stay active.  If you do not hear anything about your referral in the next 1-2 weeks, call our office and ask for an update.  Do not shower for the rest of the day. When you do wash it, use only soap and water. Do not vigorously scrub. Apply triple antibiotic ointment (like Neosporin) twice daily. Keep the area clean and dry.   Things to look out for: increasing pain not relieved by ibuprofen/acetaminophen, fevers, spreading redness, drainage of pus, or foul odor.   If the lesion on your hand is present in 1 month, the freezing has failed and we need to get it biopsied.   Give [place] around 1 week to get the results of your biopsy back.  Let us know if you need anything.

## 2020-04-27 NOTE — Progress Notes (Signed)
Chief Complaint  Patient presents with  . Annual Exam    Check hands   Well Male John Zamora is here for a complete physical.   His last physical was >1 year ago.  Current diet: in general, a "healthy" diet.   Current exercise: hiking Weight trend: stable Fatigue out of ordinary? No. Seat belt? Yes.    Health maintenance Shingrix- No Colonoscopy- No Tetanus- Yes Hep C- Yes   Lesion on hand that seems to be growing. Also a lesion on his forehead that has been growing.   Past Medical History:  Diagnosis Date  . Hemorrhoids      Past Surgical History:  Procedure Laterality Date  . ACHILLES TENDON REPAIR Right   . CHOLECYSTECTOMY N/A 03/01/2017   Procedure: LAPAROSCOPIC CHOLECYSTECTOMY WITH INTRAOPERATIVE CHOLANGIOGRAM;  Surgeon: Fanny Skates, MD;  Location: [facility];  Service: General;  Laterality: N/A;    Medications  Current Outpatient Medications on File Prior to Visit  Medication Sig Dispense Refill  . saw palmetto 160 MG capsule Take 160 mg by mouth 2 (two) times daily.      Allergies Allergies  Allergen Reactions  . Ceftriaxone Rash    Family History Family History  Problem Relation Age of Onset  . Alzheimer's disease Mother   . Cancer Father        lung cancer (life long smoker) and pancreatic  . Hyperlipidemia Brother   . Depression Maternal Grandmother     Review of Systems: Constitutional:  no fevers Eye:  no recent significant change in vision Ears:  No changes in hearing Nose/Mouth/Throat:  no complaints of nasal congestion, no sore throat Cardiovascular: no chest pain Respiratory:  No shortness of breath Gastrointestinal:  No change in bowel habits GU:  No frequency Integumentary: +skin lesions noted as above.  Neurologic:  no headaches Endocrine:  denies unexplained weight changes  Exam BP 110/72 (BP Location: Left Arm, Patient Position: Sitting, Cuff Size: Normal)   Pulse 86   Temp 98.5 F (36.9 C) (Oral)   Ht 5\' 3"  (1.6 m)   Wt  172 lb (78 kg)   SpO2 97%   BMI 30.47 kg/m  General:  well developed, well nourished, in no apparent distress Skin:  See below Head:  no masses, lesions, or tenderness Eyes:  pupils equal and round, sclera anicteric without injection Ears:  canals without lesions, TMs shiny without retraction, no obvious effusion, no erythema Nose:  nares patent, septum midline, mucosa normal Throat/Pharynx:  lips and gingiva without lesion; tongue and uvula midline; non-inflamed pharynx; no exudates or postnasal drainage Lungs:  clear to auscultation, breath sounds equal bilaterally, no respiratory distress Cardio:  regular rate and rhythm, no LE edema or bruits Rectal: Deferred GI: BS+, S, NT, ND, no masses or organomegaly Musculoskeletal:  symmetrical muscle groups noted without atrophy or deformity Neuro:  gait normal; deep tendon reflexes normal and symmetric Psych: well oriented with normal range of affect and appropriate judgment/insight      Procedure note; shave biopsy Informed consent was obtained. The area was cleaned with alcohol and injected with 0.5 mL of 1% lidocaine with epinephrine. A Dermablade was slightly bent and used to cut under the area of interest. The specimen was placed in a sterile specimen cup and sent to the lab. The area was then cauterized ensuring adequate hemostasis. The area was dressed with triple antibiotic ointment and a bandage. There were no complications noted. The patient tolerated the procedure well.  Procedure note: cryotherapy Verbal consent  obtained 1 skin lesion treated Liquid nitrogen was applied via a thin spray creating an ice ball with 1-2 mm corona surrounding the lesion The patient tolerated the procedure well There were no immediate complications noted  Assessment and Plan  Well adult exam - Plan: CBC, Comprehensive metabolic panel, Lipid panel  Screen for colon cancer - Plan: Ambulatory referral to Gastroenterology   Well 69 y.o.  male. Counseled on diet and exercise. Other orders as above. On hand, worried for AK vs SCC. Biopsy other susp lesion.  Follow up in 1 yr or prn otherwise.  The patient voiced understanding and agreement to the plan.  [place], DO 04/27/20 10:21 AM

## 2020-05-06 ENCOUNTER — Other Ambulatory Visit: Payer: Self-pay

## 2020-05-06 ENCOUNTER — Other Ambulatory Visit (INDEPENDENT_AMBULATORY_CARE_PROVIDER_SITE_OTHER): Payer: Medicare PPO

## 2020-05-06 DIAGNOSIS — E785 Hyperlipidemia, unspecified: Secondary | ICD-10-CM | POA: Diagnosis not present

## 2020-05-06 LAB — LIPID PANEL
Cholesterol: 248 mg/dL — ABNORMAL HIGH (ref 0–200)
HDL: 42.4 mg/dL (ref >39.00)
LDL Cholesterol: 175 mg/dL — ABNORMAL HIGH (ref 0–99)
NonHDL: 205.12
Total CHOL/HDL Ratio: 6
Triglycerides: 152 mg/dL — ABNORMAL HIGH (ref 0.0–149.0)
VLDL: 30.4 mg/dL (ref 0.0–40.0)

## 2020-07-29 ENCOUNTER — Encounter: Payer: Self-pay | Admitting: Family Medicine

## 2020-10-31 IMAGING — DX DG CHEST 2V
2 series · 2 of 2 positions shown · non-contrast
Comparison: None.

CLINICAL DATA: Six year history of exertional shortness of breath.
Never smoked.

EXAM:
CHEST - 2 VIEW

[chest pa]
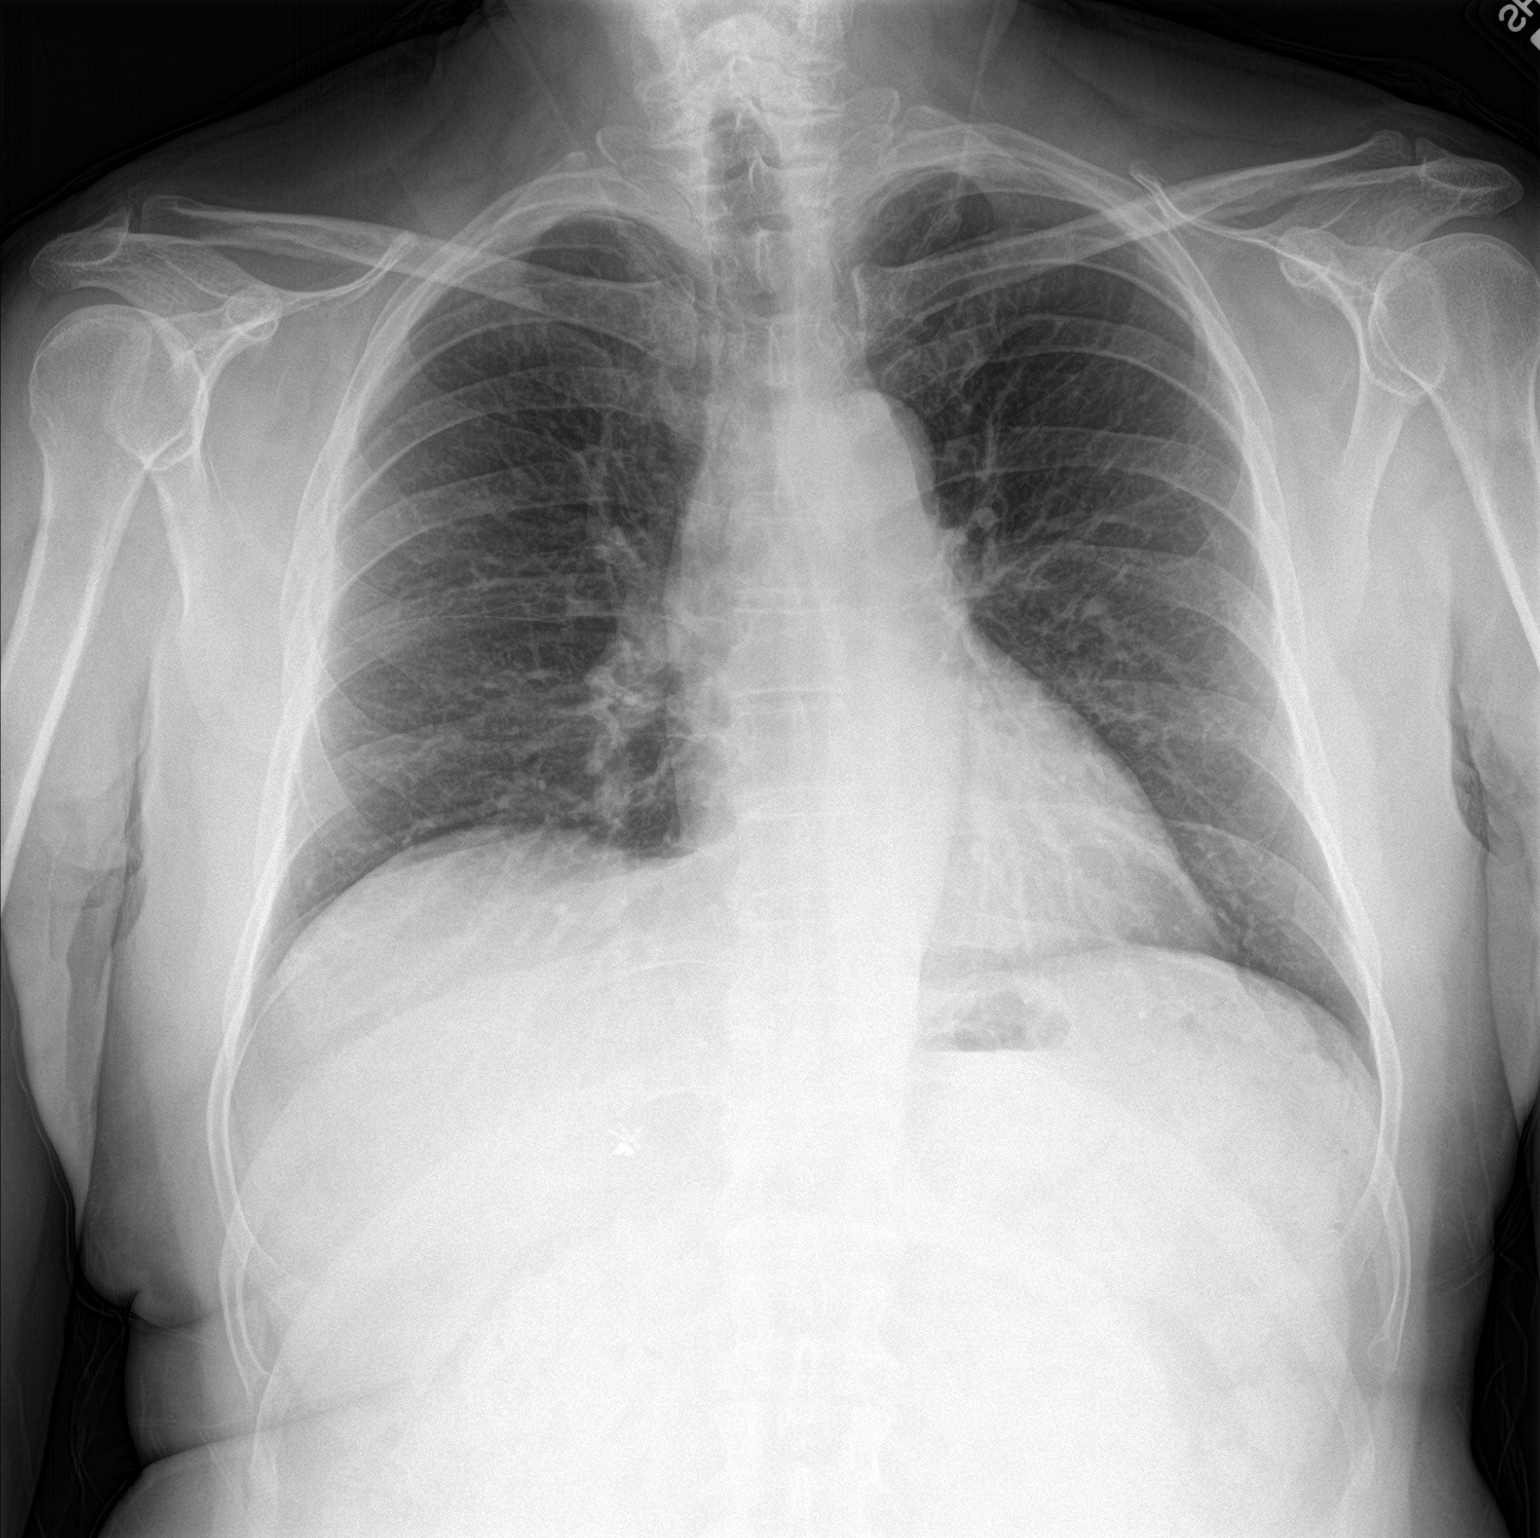

[chest lat]
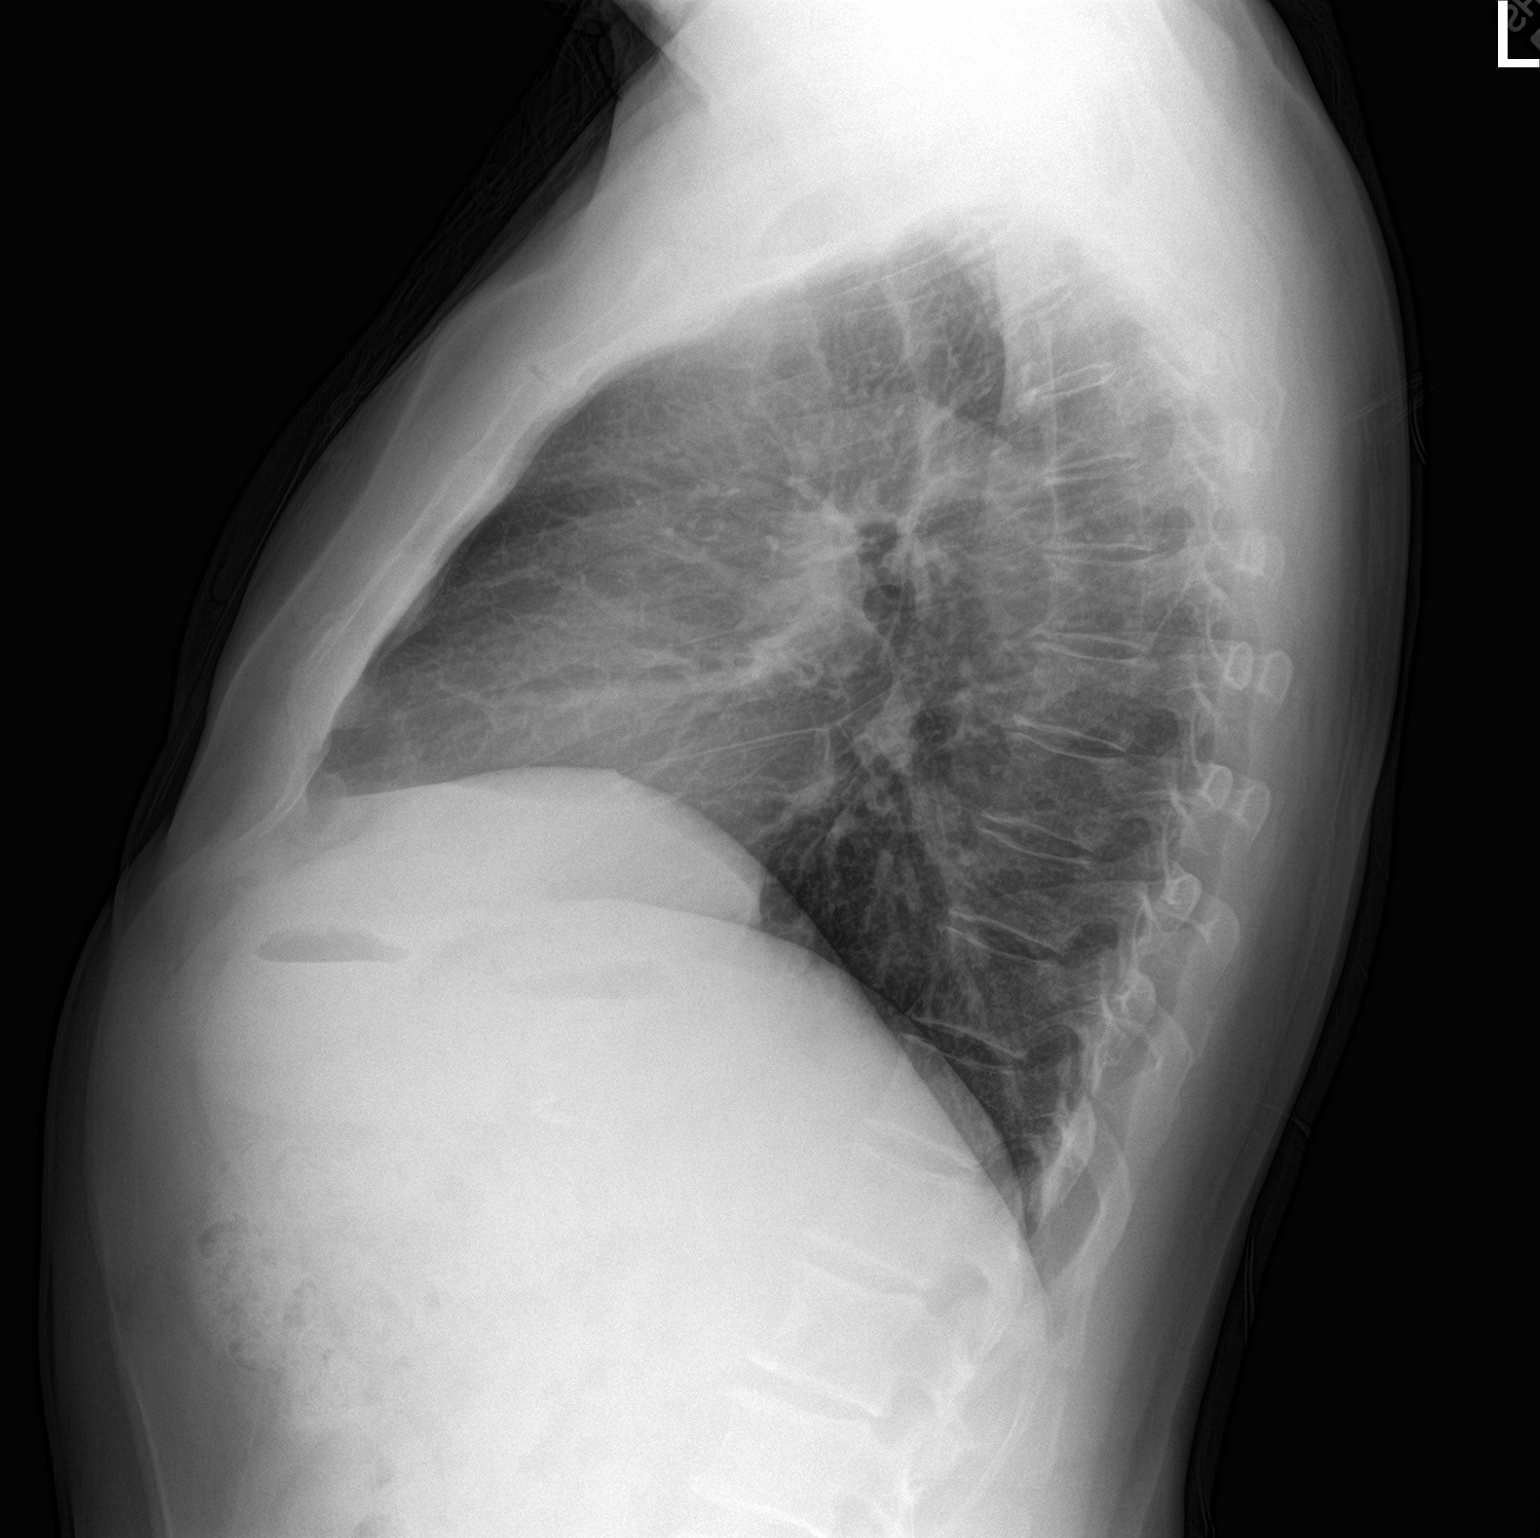

[2 of 2 positions shown; findings below may reference images not displayed]

FINDINGS: The lungs are adequately inflated and clear. The heart and pulmonary
vascularity are normal. The mediastinum is normal in width. There is
no pleural effusion. The bony thorax exhibits no acute abnormality.
IMPRESSION: There is no active cardiopulmonary disease.

## 2021-04-29 ENCOUNTER — Other Ambulatory Visit: Payer: Self-pay

## 2021-04-29 ENCOUNTER — Ambulatory Visit (INDEPENDENT_AMBULATORY_CARE_PROVIDER_SITE_OTHER): Payer: Medicare PPO | Admitting: Family Medicine

## 2021-04-29 ENCOUNTER — Other Ambulatory Visit: Payer: Self-pay | Admitting: Family Medicine

## 2021-04-29 ENCOUNTER — Encounter: Payer: Self-pay | Admitting: Family Medicine

## 2021-04-29 VITALS — BP 108/68 | HR 73 | Temp 98.1°F | Ht 63.0 in | Wt 174.0 lb

## 2021-04-29 DIAGNOSIS — E785 Hyperlipidemia, unspecified: Secondary | ICD-10-CM

## 2021-04-29 DIAGNOSIS — Z0000 Encounter for general adult medical examination without abnormal findings: Secondary | ICD-10-CM | POA: Diagnosis not present

## 2021-04-29 DIAGNOSIS — Z1211 Encounter for screening for malignant neoplasm of colon: Secondary | ICD-10-CM

## 2021-04-29 LAB — COMPREHENSIVE METABOLIC PANEL
ALT: 21 U/L (ref 0–53)
AST: 16 U/L (ref 0–37)
Albumin: 4.3 g/dL (ref 3.5–5.2)
Alkaline Phosphatase: 52 U/L (ref 39–117)
BUN: 16 mg/dL (ref 6–23)
CO2: 29 mEq/L (ref 19–32)
Calcium: 9.3 mg/dL (ref 8.4–10.5)
Chloride: 103 mEq/L (ref 96–112)
Creatinine, Ser: 0.73 mg/dL (ref 0.40–1.50)
GFR: 93.04 mL/min (ref >60.00)
Glucose, Bld: 110 mg/dL — ABNORMAL HIGH (ref 70–99)
Potassium: 4.1 mEq/L (ref 3.5–5.1)
Sodium: 139 mEq/L (ref 135–145)
Total Bilirubin: 0.7 mg/dL (ref 0.2–1.2)
Total Protein: 6.9 g/dL (ref 6.0–8.3)

## 2021-04-29 LAB — LIPID PANEL
Cholesterol: 229 mg/dL — ABNORMAL HIGH (ref 0–200)
HDL: 37.8 mg/dL — ABNORMAL LOW (ref >39.00)
NonHDL: 190.96
Total CHOL/HDL Ratio: 6
Triglycerides: 207 mg/dL — ABNORMAL HIGH (ref 0.0–149.0)
VLDL: 41.4 mg/dL — ABNORMAL HIGH (ref 0.0–40.0)

## 2021-04-29 LAB — LDL CHOLESTEROL, DIRECT: Direct LDL: 164 mg/dL

## 2021-04-29 NOTE — Progress Notes (Signed)
Chief Complaint  Patient presents with   Annual Exam    Well Male John Zamora is here for a complete physical.   His last physical was >1 year ago.  Current diet: in general, a "pretty healthy" diet.   Current exercise: walking Weight trend: stable Fatigue out of ordinary? No. Seat belt? Yes.   Loss of interested in doing things or depression in past 2 weeks? No  Health maintenance Shingrix- No Colonoscopy- No Tetanus- Yes Hep C- Yes Pneumonia vaccine- Yes  Past Medical History:  Diagnosis Date   Hemorrhoids      Past Surgical History:  Procedure Laterality Date   ACHILLES TENDON REPAIR Right    CHOLECYSTECTOMY N/A 03/01/2017   Procedure: LAPAROSCOPIC CHOLECYSTECTOMY WITH INTRAOPERATIVE CHOLANGIOGRAM;  Surgeon: Fanny Skates, MD;  Location: [facility];  Service: General;  Laterality: N/A;    Medications  Current Outpatient Medications on File Prior to Visit  Medication Sig Dispense Refill   saw palmetto 160 MG capsule Take 160 mg by mouth 2 (two) times daily.     Allergies Allergies  Allergen Reactions   Ceftriaxone Rash    Family History Family History  Problem Relation Age of Onset   Alzheimer's disease Mother    Cancer Father        lung cancer (life long smoker) and pancreatic   Hyperlipidemia Brother    Depression Maternal Grandmother     Review of Systems: Constitutional:  no fevers Eye:  no recent significant change in vision Ears:  No changes in hearing Nose/Mouth/Throat:  no complaints of nasal congestion, no sore throat Cardiovascular: no chest pain Respiratory:  No shortness of breath Gastrointestinal:  No change in bowel habits GU:  No frequency Integumentary:  no abnormal skin lesions reported Neurologic:  no headaches Endocrine:  denies unexplained weight changes  Exam BP 108/68   Pulse 73   Temp 98.1 F (36.7 C) (Oral)   Ht '5\' 3"'$  (1.6 m)   Wt 174 lb (78.9 kg)   SpO2 97%   BMI 30.82 kg/m  General:  well developed, well  nourished, in no apparent distress Skin:  no significant moles, warts, or growths Head:  no masses, lesions, or tenderness Eyes:  pupils equal and round, sclera anicteric without injection Ears:  canals without lesions, TMs shiny without retraction, no obvious effusion, no erythema Nose:  nares patent, septum midline, mucosa normal Throat/Pharynx:  lips and gingiva without lesion; tongue and uvula midline; non-inflamed pharynx; no exudates or postnasal drainage Lungs:  clear to auscultation, breath sounds equal bilaterally, no respiratory distress Cardio:  regular rate and rhythm, no LE edema or bruits Rectal: Deferred GI: BS+, S, NT, ND, no masses or organomegaly Musculoskeletal:  symmetrical muscle groups noted without atrophy or deformity Neuro:  gait normal; deep tendon reflexes normal and symmetric Psych: well oriented with normal range of affect and appropriate judgment/insight  Assessment and Plan  Well adult exam  Hyperlipidemia, unspecified hyperlipidemia type - Plan: Comprehensive metabolic panel, Lipid panel   [name]. Counseled on diet and exercise. Other orders as above. Declines colonoscopy. Will set up w [facility].  Rec'd Shingrix. Rec'd covid booster, could wait until 6 mo out from his infection and see if updated booster exists.  Rec'd wt resistance exercise. If cholesterol still high, will ck CACS. Discussed OOP cost with this.  Follow up in 1 yr pending above.  The patient voiced understanding and agreement to the plan.  [place], DO 04/29/21 9:17 AM

## 2021-04-29 NOTE — Patient Instructions (Addendum)
Give [place] 2-3 business days to get the results of your labs back.   Keep the diet clean and stay active. Add some weight resistance exercise to your routine.  If you don't have your kit in the next few weeks, please reach out.   Let us know if you need anything.

## 2021-05-06 DIAGNOSIS — Z1211 Encounter for screening for malignant neoplasm of colon: Secondary | ICD-10-CM | POA: Diagnosis not present

## 2021-05-13 DIAGNOSIS — H2512 Age-related nuclear cataract, left eye: Secondary | ICD-10-CM | POA: Diagnosis not present

## 2021-05-13 DIAGNOSIS — Z01818 Encounter for other preprocedural examination: Secondary | ICD-10-CM | POA: Diagnosis not present

## 2021-05-13 LAB — COLOGUARD
COLOGUARD: NEGATIVE
Cologuard: NEGATIVE

## 2021-05-16 ENCOUNTER — Other Ambulatory Visit: Payer: Self-pay

## 2021-05-16 ENCOUNTER — Encounter: Payer: Self-pay | Admitting: Family Medicine

## 2021-05-16 DIAGNOSIS — Z1211 Encounter for screening for malignant neoplasm of colon: Secondary | ICD-10-CM

## 2021-05-16 NOTE — Addendum Note (Signed)
Addended by: Gerilyn Nestle on: 05/16/2021 08:16 AM   Modules accepted: Orders

## 2022-03-16 DIAGNOSIS — H2512 Age-related nuclear cataract, left eye: Secondary | ICD-10-CM | POA: Diagnosis not present

## 2022-03-16 DIAGNOSIS — Z01818 Encounter for other preprocedural examination: Secondary | ICD-10-CM | POA: Diagnosis not present

## 2022-03-16 DIAGNOSIS — H2513 Age-related nuclear cataract, bilateral: Secondary | ICD-10-CM | POA: Diagnosis not present

## 2022-03-27 DIAGNOSIS — H2512 Age-related nuclear cataract, left eye: Secondary | ICD-10-CM | POA: Diagnosis not present

## 2022-03-27 DIAGNOSIS — H269 Unspecified cataract: Secondary | ICD-10-CM | POA: Diagnosis not present

## 2022-04-10 DIAGNOSIS — H2511 Age-related nuclear cataract, right eye: Secondary | ICD-10-CM | POA: Diagnosis not present

## 2022-04-10 DIAGNOSIS — H269 Unspecified cataract: Secondary | ICD-10-CM | POA: Diagnosis not present

## 2022-05-03 ENCOUNTER — Encounter: Payer: Self-pay | Admitting: Family Medicine

## 2022-05-03 ENCOUNTER — Ambulatory Visit (INDEPENDENT_AMBULATORY_CARE_PROVIDER_SITE_OTHER): Payer: Medicare PPO | Admitting: Family Medicine

## 2022-05-03 VITALS — BP 118/76 | HR 89 | Temp 97.8°F | Ht 63.0 in | Wt 170.2 lb

## 2022-05-03 DIAGNOSIS — E785 Hyperlipidemia, unspecified: Secondary | ICD-10-CM

## 2022-05-03 DIAGNOSIS — C44622 Squamous cell carcinoma of skin of right upper limb, including shoulder: Secondary | ICD-10-CM | POA: Diagnosis not present

## 2022-05-03 DIAGNOSIS — M65331 Trigger finger, right middle finger: Secondary | ICD-10-CM | POA: Diagnosis not present

## 2022-05-03 DIAGNOSIS — Z0001 Encounter for general adult medical examination with abnormal findings: Secondary | ICD-10-CM | POA: Diagnosis not present

## 2022-05-03 DIAGNOSIS — Z23 Encounter for immunization: Secondary | ICD-10-CM | POA: Diagnosis not present

## 2022-05-03 DIAGNOSIS — Z0000 Encounter for general adult medical examination without abnormal findings: Secondary | ICD-10-CM

## 2022-05-03 DIAGNOSIS — D489 Neoplasm of uncertain behavior, unspecified: Secondary | ICD-10-CM

## 2022-05-03 NOTE — Patient Instructions (Addendum)
Give [place] 2-3 business days to get the results of your labs back.   Keep the diet clean and stay active.  I recommend getting the flu shot in mid October. This suggestion would change if the CDC comes out with a different recommendation.   Please get me a copy of your advanced directive form at your convenience.   The Shingrix vaccine (for shingles) is a 2 shot series spaced 2-6 months apart. It can make people feel low energy, achy and almost like they have the flu for 48 hours after injection. 1/5 people can have nausea and/or vomiting. Please plan accordingly when deciding on when to get this shot. Call your pharmacy to get this. The second shot of the series is less severe regarding the side effects, but it still lasts 48 hours.   Do not shower for the rest of the day. When you do wash it, use only soap and water. Do not vigorously scrub. Apply triple antibiotic ointment (like Neosporin) twice daily. Keep the area clean and dry.   Things to look out for: increasing pain not relieved by ibuprofen/acetaminophen, fevers, spreading redness, drainage of pus, or foul odor.  Give [place] 1 week to get the results of your biopsy back.  Wear the splint, if things don't improve, return and we can inject the area on the palm.   Let us know if you need anything.

## 2022-05-03 NOTE — Progress Notes (Signed)
Chief Complaint  Patient presents with   Annual Exam    Well Male John Zamora is here for a complete physical.   His last physical was >1 year ago.  Current diet: in general, a "healthy" diet.   Current exercise: hiking, gardening Weight trend: stable Fatigue out of ordinary? No. Seat belt? Yes.   Advanced directive? No  Health maintenance Shingrix- No CCS- Yes Tetanus- Yes Hep C- Yes Pneumonia vaccine- Due for PCV20  Skin lesion Patient has had a raised skin lesion on his right forearm over the past few weeks.  No injury or change in activity.  No new lotions, soaps, topicals, or detergents.  It does not hurt, itch, or drain.  He does not think it is changing in size.  He does not follow with a dermatologist.  He has not tried anything at home so far.  Hand pain Over the past several months, the patient has had triggering and loss of range of motion in his right middle finger.  No injury or change in activity.  He is starting to drop things because of it.  He has not tried anything at home so far.  Past Medical History:  Diagnosis Date   Hemorrhoids      Past Surgical History:  Procedure Laterality Date   ACHILLES TENDON REPAIR Right    CHOLECYSTECTOMY N/A 03/01/2017   Procedure: LAPAROSCOPIC CHOLECYSTECTOMY WITH INTRAOPERATIVE CHOLANGIOGRAM;  Surgeon: Fanny Skates, MD;  Location: [facility];  Service: General;  Laterality: N/A;    Medications  Current Outpatient Medications on File Prior to Visit  Medication Sig Dispense Refill   prednisoLONE acetate (PRED FORTE) 1 % ophthalmic suspension      saw palmetto 160 MG capsule Take 160 mg by mouth 2 (two) times daily.     Allergies Allergies  Allergen Reactions   Ceftriaxone Rash    Family History Family History  Problem Relation Age of Onset   Alzheimer's disease Mother    Cancer Father        lung cancer (life long smoker) and pancreatic   Hyperlipidemia Brother    Depression Maternal Grandmother     Review  of Systems: Constitutional:  no fevers Eye:  no recent significant change in vision Ears:  No changes in hearing Nose/Mouth/Throat:  no complaints of nasal congestion, no sore throat Cardiovascular: no chest pain Respiratory:  No shortness of breath Gastrointestinal:  No change in bowel habits GU:  No frequency Integumentary:  + Skin lesion as above Neurologic:  no headaches Endocrine:  denies unexplained weight changes  Exam BP 118/76   Pulse 89   Temp 97.8 F (36.6 C) (Oral)   Ht '5\' 3"'$  (1.6 m)   Wt 170 lb 4 oz (77.2 kg)   SpO2 97%   BMI 30.16 kg/m  General:  well developed, well nourished, in no apparent distress Skin: On the right mid forearm posteriorly, there is a circular and raised lesion with a central hyperkeratinization measuring 0.6 cm in diameter.  There is no tenderness, fluctuance, drainage, or excoriation.  Otherwise no significant moles, warts, or growths Head:  no masses, lesions, or tenderness Eyes:  pupils equal and round, sclera anicteric without injection Ears:  canals without lesions, TMs shiny without retraction, no obvious effusion, no erythema Nose:  nares patent, septum midline, mucosa normal Throat/Pharynx:  lips and gingiva without lesion; tongue and uvula midline; non-inflamed pharynx; no exudates or postnasal drainage Lungs:  clear to auscultation, breath sounds equal bilaterally, no respiratory distress Cardio:  regular rate and rhythm, no LE edema or bruits Rectal: Deferred GI: BS+, S, NT, ND, no masses or organomegaly Musculoskeletal: TTP over the palmar crease over the third digit on the right hand.  I do not appreciate any nodularity.  There is no tenderness over the PIP joint of the third digit.  Normal active and passive range of motion. Neuro:  gait normal; grip strength is adequate.  Deep tendon reflexes normal and symmetric Psych: well oriented with normal range of affect and appropriate judgment/insight  Procedure note; shave  biopsy Informed consent was obtained. The area was cleaned with alcohol and injected with 0.5 mL of 1% lidocaine with epinephrine. A Dermablade was slightly bent and used to cut under the area of interest. The specimen was placed in a sterile specimen cup and sent to the lab. The area was then cauterized ensuring adequate hemostasis. The area was dressed with triple antibiotic ointment and a bandage. There were no complications noted. The patient tolerated the procedure well.  Assessment and Plan  Well adult exam  Hyperlipidemia, unspecified hyperlipidemia type - Plan: Comprehensive metabolic panel, Lipid panel  Neoplasm of uncertain behavior - Plan: PR SHAV SKIN LES 0.6-1.0 CM TRUNK,ARM,LEG  Trigger middle finger of right hand   Well 71 y.o. male. Counseled on diet and exercise. Advanced directive form provided today.  Shingrix rec'd.  PCV20 today.  Trigger finger: Aluminum finger splint, ice, Tylenol. RTC if no improvement for possible steroid injection. Skin lesion: Biopsy today to r/o cancer. Aftercare instructions verbalized and written down.  Other orders as above. Follow up in 1 yr or prn.  The patient voiced understanding and agreement to the plan.  [place], DO 05/03/22 9:37 AM

## 2022-05-03 NOTE — Addendum Note (Signed)
Addended by: Sharon Seller B on: 05/03/2022 09:47 AM   Modules accepted: Orders

## 2022-05-05 ENCOUNTER — Other Ambulatory Visit (INDEPENDENT_AMBULATORY_CARE_PROVIDER_SITE_OTHER): Payer: Medicare PPO

## 2022-05-05 DIAGNOSIS — E785 Hyperlipidemia, unspecified: Secondary | ICD-10-CM

## 2022-05-05 LAB — COMPREHENSIVE METABOLIC PANEL
ALT: 23 U/L (ref 0–53)
AST: 18 U/L (ref 0–37)
Albumin: 4.5 g/dL (ref 3.5–5.2)
Alkaline Phosphatase: 54 U/L (ref 39–117)
BUN: 16 mg/dL (ref 6–23)
CO2: 32 mEq/L (ref 19–32)
Calcium: 9.3 mg/dL (ref 8.4–10.5)
Chloride: 103 mEq/L (ref 96–112)
Creatinine, Ser: 0.92 mg/dL (ref 0.40–1.50)
GFR: 84.46 mL/min (ref >60.00)
Glucose, Bld: 87 mg/dL (ref 70–99)
Potassium: 4.7 mEq/L (ref 3.5–5.1)
Sodium: 140 mEq/L (ref 135–145)
Total Bilirubin: 0.7 mg/dL (ref 0.2–1.2)
Total Protein: 7.1 g/dL (ref 6.0–8.3)

## 2022-05-05 LAB — LIPID PANEL
Cholesterol: 236 mg/dL — ABNORMAL HIGH (ref 0–200)
HDL: 41.1 mg/dL (ref >39.00)
LDL Cholesterol: 169 mg/dL — ABNORMAL HIGH (ref 0–99)
NonHDL: 195.17
Total CHOL/HDL Ratio: 6
Triglycerides: 130 mg/dL (ref 0.0–149.0)
VLDL: 26 mg/dL (ref 0.0–40.0)

## 2022-05-09 ENCOUNTER — Encounter: Payer: Self-pay | Admitting: Family Medicine

## 2022-05-10 ENCOUNTER — Ambulatory Visit (INDEPENDENT_AMBULATORY_CARE_PROVIDER_SITE_OTHER): Payer: Medicare PPO | Admitting: Family Medicine

## 2022-05-10 ENCOUNTER — Encounter: Payer: Self-pay | Admitting: Family Medicine

## 2022-05-10 DIAGNOSIS — C44622 Squamous cell carcinoma of skin of right upper limb, including shoulder: Secondary | ICD-10-CM | POA: Diagnosis not present

## 2022-05-10 DIAGNOSIS — D0461 Carcinoma in situ of skin of right upper limb, including shoulder: Secondary | ICD-10-CM

## 2022-05-10 NOTE — Patient Instructions (Addendum)
Do not shower for the rest of the day. When you do wash it, use only soap and water. Do not vigorously scrub. Apply triple antibiotic ointment (like Neosporin) twice daily. Keep the area clean and dry.   Things to look out for: increasing pain not relieved by ibuprofen/acetaminophen, fevers, spreading redness, drainage of pus, or foul odor.  Ice/cold pack over area for 10-15 min twice daily.  OK to take Tylenol 1000 mg (2 extra strength tabs) or 975 mg (3 regular strength tabs) every 6 hours as needed.  Let us know if you need anything.

## 2022-05-10 NOTE — Progress Notes (Signed)
No chief complaint on file.   Subjective: Patient is a 71 y.o. male here for f/u.  Had skin biopsy last week showing SCC of R forearm. Here for E&C.   Past Medical History:  Diagnosis Date   Hemorrhoids     Objective: General: Awake, appears stated age Skin: Excoriated 0.6 cm in diameter circular patch on R forearm postero-medially Lungs: No accessory muscle use Psych: Age appropriate judgment and insight, normal affect and mood  Procedure note: Electrodesiccation and curettage Informed consent obtained. The area of interest was cleaned with alcohol and anesthetized with 1.5 mL of 1% lidocaine with epinephrine. After adequate anesthesia was obtained, the area was electrodesiccated with a Hyfrecator and the charred tissue was scraped away with a curette. This process was repeated 2 more times for a total of 3 rounds. The cancerous tissue was easily noted by texture under the curette compared to the healthy surrounding tissue After the final round, the area was cauterized to ensure adequate hemostasis. Triple antibiotic ointment and a bandage were placed. There were no immediate complications noted. The patient tolerated the procedure well.  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 If any portion of lesion comes back, he will let me know. Aftercare instructions verbalized and written down. F/u prn.  The patient voiced understanding and agreement to the plan.  [place], DO 05/10/22  3:21 PM

## 2022-09-18 ENCOUNTER — Ambulatory Visit (INDEPENDENT_AMBULATORY_CARE_PROVIDER_SITE_OTHER): Payer: Medicare PPO | Admitting: Family Medicine

## 2022-09-18 ENCOUNTER — Encounter: Payer: Self-pay | Admitting: Family Medicine

## 2022-09-18 VITALS — BP 118/68 | HR 73 | Temp 98.3°F | Ht 63.0 in | Wt 167.2 lb

## 2022-09-18 DIAGNOSIS — L570 Actinic keratosis: Secondary | ICD-10-CM | POA: Diagnosis not present

## 2022-09-18 DIAGNOSIS — Z23 Encounter for immunization: Secondary | ICD-10-CM

## 2022-09-18 DIAGNOSIS — D489 Neoplasm of uncertain behavior, unspecified: Secondary | ICD-10-CM

## 2022-09-18 NOTE — Patient Instructions (Signed)
Do not shower for the rest of the day. When you do wash it, use only soap and water. Do not vigorously scrub. Apply triple antibiotic ointment (like Neosporin) twice daily. Keep the area clean and dry.   Things to look out for: increasing pain not relieved by ibuprofen/acetaminophen, fevers, spreading redness, drainage of pus, or foul odor.  Give us 1 week to get the results of your biopsy back.  Let us know if you need anything. 

## 2022-09-18 NOTE — Progress Notes (Signed)
Chief Complaint  Patient presents with   spot on arm    Romir Klimowicz is a 71 y.o. male here for a skin complaint.  Duration: 2 months Location: R upper arm Pruritic? No Painful? No Drainage? No New soaps/lotions/topicals/detergents? No Other associated symptoms: Gotten slightly bigger; no fevers, redness Therapies tried thus far: none  Past Medical History:  Diagnosis Date   Hemorrhoids     BP 118/68 (BP Location: Left Arm, Patient Position: Sitting, Cuff Size: Normal)   Pulse 73   Temp 98.3 F (36.8 C) (Oral)   Ht '5\' 3"'$  (1.6 m)   Wt 167 lb 4 oz (75.9 kg)   SpO2 97%   BMI 29.63 kg/m  Gen: awake, alert, appearing stated age Lungs: No accessory muscle use Skin: See below. No drainage, erythema, TTP, fluctuance, excoriation Psych: Age appropriate judgment and insight   Right lateral arm  Procedure note; excisional biopsy Informed consent was obtained. 0.3 x 0.4 cm lesion w 0.5 cm margins on each side The area was cleaned with alcohol and injected with 3 mL of 1% lidocaine with epinephrine. An elliptical track around the area of interest was demarcated. A 15 blade scalpel was then used to follow the elliptical track as demarcated. Forceps were used to create traction to enhance dissection of the tissue planes and removal of the specimen. The specimen was placed a sterile specimen cup and sent to the lab. 2 deep sutures w 3-0 plain gut placed. 4 Simple and 1 vertical mattress with 5-0 nylon sutures were placed with good approximation of wound edges. The area was dressed with triple antibiotic ointment and a bandage. There were no complications noted. The patient tolerated the procedure well.  Neoplasm of uncertain behavior - Plan: Surgical pathology( / [facility]), PR EXC SKIN BENIG 1.1-2 CM TRUNK,ARM,LEG  Need for influenza vaccination - Plan: Flu Vaccine QUAD High Dose(Fluad)  This is very similar to a previous area of skin cancer he has had before on  the same extremity.  I did offer to shave it and send it for biopsy results or we can excise it decreasing his likelihood of needing to come back.  He opted for the latter.  Aftercare instructions verbalized written down.  Follow-up in 8 days to have his sutures removed. The patient voiced understanding and agreement to the plan.  [place], DO 09/18/22 4:00 PM

## 2022-09-26 ENCOUNTER — Ambulatory Visit: Payer: Medicare PPO | Admitting: Family Medicine

## 2022-09-26 ENCOUNTER — Encounter: Payer: Self-pay | Admitting: Family Medicine

## 2022-09-26 DIAGNOSIS — D489 Neoplasm of uncertain behavior, unspecified: Secondary | ICD-10-CM

## 2022-09-26 NOTE — Patient Instructions (Signed)
Use butterfly bandaids to minimize scarring.   Continue using triple antibiotic ointment.   Let us know if you need anything.

## 2022-09-26 NOTE — Progress Notes (Signed)
S: Here for f/u and suture removal O: Alert, oriented, AF, skin well approximated w linear scab. Sutures in place. A/P: F/u for suture removal. 4 simple and 1 V mattress suture removed. Counted suture in front of pt. Will wear butterfly bandages and cont bid TAO. He voiced understanding and agreement to the plan.  John Zamora 1:45 PM 09/26/22

## 2022-09-27 ENCOUNTER — Encounter: Payer: Self-pay | Admitting: Family Medicine

## 2022-11-07 ENCOUNTER — Telehealth: Payer: Self-pay | Admitting: Family Medicine

## 2022-11-07 NOTE — Telephone Encounter (Signed)
Copied from [place] [id number]. Topic: Medicare AWV >> Nov 07, 2022 10:03 AM Devoria Glassing wrote: Reason for CRM: Left message for patient to schedule Annual Wellness Visit(AWV).  Please schedule with Health Nurse Advisor at [facility]. Please call [phone] ask for [facility].

## 2023-01-31 ENCOUNTER — Telehealth: Payer: Self-pay | Admitting: Family Medicine

## 2023-01-31 NOTE — Telephone Encounter (Signed)
Contacted [name] to schedule their annual wellness visit. Appointment made for 03/06/2023.  Verlee Rossetti; Care Guide Ambulatory Clinical Support [facility] Direct Dial: [phone]

## 2023-03-06 ENCOUNTER — Ambulatory Visit: Payer: Medicare PPO

## 2023-03-07 ENCOUNTER — Ambulatory Visit (INDEPENDENT_AMBULATORY_CARE_PROVIDER_SITE_OTHER): Payer: Medicare PPO | Admitting: *Deleted

## 2023-03-07 DIAGNOSIS — Z0000 Encounter for general adult medical examination without abnormal findings: Secondary | ICD-10-CM

## 2023-03-07 NOTE — Patient Instructions (Signed)
John Zamora , Thank you for taking time to come for your Medicare Wellness Visit. I appreciate your ongoing commitment to your health goals. Please review the following plan we discussed and let me know if I can assist you in the future.   These are the goals we discussed:  Goals   None     This is a list of the screening recommended for you and due dates:  Health Maintenance  Topic Date Due   Zoster (Shingles) Vaccine (1 of 2) Never done   COVID-19 Vaccine (3 - Pfizer risk series) 12/26/2019   Flu Shot  05/03/2023   Medicare Annual Wellness Visit  03/06/2024   Cologuard (Stool DNA test)  05/13/2024   DTaP/Tdap/Td vaccine (2 - Td or Tdap) 09/30/2028   Pneumonia Vaccine  Completed   Hepatitis C Screening  Completed   HPV Vaccine  Aged Out   Colon Cancer Screening  Discontinued     Next appointment: Follow up in one year for your annual wellness visit.   Preventive Care 44 Years and Older, Male Preventive care refers to lifestyle choices and visits with your health care provider that can promote health and wellness. What does preventive care include? A yearly physical exam. This is also called an annual well check. Dental exams once or twice a year. Routine eye exams. Ask your health care provider how often you should have your eyes checked. Personal lifestyle choices, including: Daily care of your teeth and gums. Regular physical activity. Eating a healthy diet. Avoiding tobacco and drug use. Limiting alcohol use. Practicing safe sex. Taking low doses of aspirin every day. Taking vitamin and mineral supplements as recommended by your health care provider. What happens during an annual well check? The services and screenings done by your health care provider during your annual well check will depend on your age, overall health, lifestyle risk factors, and family history of disease. Counseling  Your health care provider may ask you questions about your: Alcohol use. Tobacco  use. Drug use. Emotional well-being. Home and relationship well-being. Sexual activity. Eating habits. History of falls. Memory and ability to understand (cognition). Work and work Astronomer. Screening  You may have the following tests or measurements: Height, weight, and BMI. Blood pressure. Lipid and cholesterol levels. These may be checked every 5 years, or more frequently if you are over 71 years old. Skin check. Lung cancer screening. You may have this screening every year starting at age 71 if you have a 30-pack-year history of smoking and currently smoke or have quit within the past 15 years. Fecal occult blood test (FOBT) of the stool. You may have this test every year starting at age 71. Flexible sigmoidoscopy or colonoscopy. You may have a sigmoidoscopy every 5 years or a colonoscopy every 10 years starting at age 71. Prostate cancer screening. Recommendations will vary depending on your family history and other risks. Hepatitis C blood test. Hepatitis B blood test. Sexually transmitted disease (STD) testing. Diabetes screening. This is done by checking your blood sugar (glucose) after you have not eaten for a while (fasting). You may have this done every 1-3 years. Abdominal aortic aneurysm (AAA) screening. You may need this if you are a current or former smoker. Osteoporosis. You may be screened starting at age 71 if you are at high risk. Talk with your health care provider about your test results, treatment options, and if necessary, the need for more tests. Vaccines  Your health care provider may recommend certain vaccines,  such as: Influenza vaccine. This is recommended every year. Tetanus, diphtheria, and acellular pertussis (Tdap, Td) vaccine. You may need a Td booster every 10 years. Zoster vaccine. You may need this after age 71. Pneumococcal 13-valent conjugate (PCV13) vaccine. One dose is recommended after age 3. Pneumococcal polysaccharide (PPSV23) vaccine.  One dose is recommended after age 18. Talk to your health care provider about which screenings and vaccines you need and how often you need them. This information is not intended to replace advice given to you by your health care provider. Make sure you discuss any questions you have with your health care provider. Document Released: 10/15/2015 Document Revised: 06/07/2016 Document Reviewed: 07/20/2015 Elsevier Interactive Patient Education  2017 [employer].  Fall Prevention in the Home Falls can cause injuries. They can happen to people of all ages. There are many things you can do to make your home safe and to help prevent falls. What can I do on the outside of my home? Regularly fix the edges of walkways and driveways and fix any cracks. Remove anything that might make you trip as you walk through a door, such as a raised step or threshold. Trim any bushes or trees on the path to your home. Use bright outdoor lighting. Clear any walking paths of anything that might make someone trip, such as rocks or tools. Regularly check to see if handrails are loose or broken. Make sure that both sides of any steps have handrails. Any raised decks and porches should have guardrails on the edges. Have any leaves, snow, or ice cleared regularly. Use sand or salt on walking paths during winter. Clean up any spills in your garage right away. This includes oil or grease spills. What can I do in the bathroom? Use night lights. Install grab bars by the toilet and in the tub and shower. Do not use towel bars as grab bars. Use non-skid mats or decals in the tub or shower. If you need to sit down in the shower, use a plastic, non-slip stool. Keep the floor dry. Clean up any water that spills on the floor as soon as it happens. Remove soap buildup in the tub or shower regularly. Attach bath mats securely with double-sided non-slip rug tape. Do not have throw rugs and other things on the floor that can make  you trip. What can I do in the bedroom? Use night lights. Make sure that you have a light by your bed that is easy to reach. Do not use any sheets or blankets that are too big for your bed. They should not hang down onto the floor. Have a firm chair that has side arms. You can use this for support while you get dressed. Do not have throw rugs and other things on the floor that can make you trip. What can I do in the kitchen? Clean up any spills right away. Avoid walking on wet floors. Keep items that you use a lot in easy-to-reach places. If you need to reach something above you, use a strong step stool that has a grab bar. Keep electrical cords out of the way. Do not use floor polish or wax that makes floors slippery. If you must use wax, use non-skid floor wax. Do not have throw rugs and other things on the floor that can make you trip. What can I do with my stairs? Do not leave any items on the stairs. Make sure that there are handrails on both sides of the stairs and  use them. Fix handrails that are broken or loose. Make sure that handrails are as long as the stairways. Check any carpeting to make sure that it is firmly attached to the stairs. Fix any carpet that is loose or worn. Avoid having throw rugs at the top or bottom of the stairs. If you do have throw rugs, attach them to the floor with carpet tape. Make sure that you have a light switch at the top of the stairs and the bottom of the stairs. If you do not have them, ask someone to add them for you. What else can I do to help prevent falls? Wear shoes that: Do not have high heels. Have rubber bottoms. Are comfortable and fit you well. Are closed at the toe. Do not wear sandals. If you use a stepladder: Make sure that it is fully opened. Do not climb a closed stepladder. Make sure that both sides of the stepladder are locked into place. Ask someone to hold it for you, if possible. Clearly mark and make sure that you can  see: Any grab bars or handrails. First and last steps. Where the edge of each step is. Use tools that help you move around (mobility aids) if they are needed. These include: Canes. Walkers. Scooters. Crutches. Turn on the lights when you go into a dark area. Replace any light bulbs as soon as they burn out. Set up your furniture so you have a clear path. Avoid moving your furniture around. If any of your floors are uneven, fix them. If there are any pets around you, be aware of where they are. Review your medicines with your doctor. Some medicines can make you feel dizzy. This can increase your chance of falling. Ask your doctor what other things that you can do to help prevent falls. This information is not intended to replace advice given to you by your health care provider. Make sure you discuss any questions you have with your health care provider. Document Released: 07/15/2009 Document Revised: 02/24/2016 Document Reviewed: 10/23/2014 Elsevier Interactive Patient Education  2017 [employer].

## 2023-03-07 NOTE — Progress Notes (Addendum)
Subjective:  Pt completed ADLs, Fall risk, and SDOH during e-check in on 02/28/23.  Answers verified with pt.    John Zamora is a 72 y.o. male who presents for Medicare Annual/Initial preventive examination.  I connected with  John Zamora on 03/07/23 by a audio enabled telemedicine application and verified that I am speaking with the correct person using two identifiers.  Patient Location: Home  Provider Location: Office/Clinic  I discussed the limitations of evaluation and management by telemedicine. The patient expressed understanding and agreed to proceed.   Review of Systems     Cardiac Risk Factors include: advanced age (>48men, >29 women);male gender     Objective:    Today's Vitals   There is no height or weight on file to calculate BMI.     03/07/2023    9:41 AM 02/28/2017   12:26 PM 02/28/2017    9:53 AM 02/28/2017    1:52 AM  Advanced Directives  Does Patient Have a Medical Advance Directive? No No No No  Would patient like information on creating a medical advance directive? No - Patient declined No - Patient declined Yes (ED - Information included in AVS) No - Patient declined    Current Medications (verified) Outpatient Encounter Medications as of 03/07/2023  Medication Sig   saw palmetto 160 MG capsule Take 160 mg by mouth 2 (two) times daily.   No facility-administered encounter medications on file as of 03/07/2023.    Allergies (verified) Ceftriaxone   History: Past Medical History:  Diagnosis Date   Allergy 03/01/2017   Cataract June 2021   Surgery June 26/July 10   Hemorrhoids    Past Surgical History:  Procedure Laterality Date   ACHILLES TENDON REPAIR Right    CHOLECYSTECTOMY N/A 03/01/2017   Procedure: LAPAROSCOPIC CHOLECYSTECTOMY WITH INTRAOPERATIVE CHOLANGIOGRAM;  Surgeon: Claud Kelp, MD;  Location: [facility];  Service: General;  Laterality: N/A;   Family History  Problem Relation Age of Onset   Alzheimer's disease Mother    Cancer  Father        lung cancer (life long smoker) and pancreatic   Hyperlipidemia Brother    Depression Maternal Grandmother    Social History   Socioeconomic History   Marital status: Married    Spouse name: Not on file   Number of children: Not on file   Years of education: Not on file   Highest education level: Not on file  Occupational History   Not on file  Tobacco Use   Smoking status: Never   Smokeless tobacco: Never  Vaping Use   Vaping Use: Never used  Substance and Sexual Activity   Alcohol use: Not Currently   Drug use: No   Sexual activity: Yes  Other Topics Concern   Not on file  Social History Narrative   Not on file   Social Determinants of Health   Financial Resource Strain: Low Risk  (02/28/2023)   Overall Financial Resource Strain (CARDIA)    Difficulty of Paying Living Expenses: Not hard at all  Food Insecurity: No Food Insecurity (02/28/2023)   Hunger Vital Sign    Worried About Running Out of Food in the Last Year: Never true    Ran Out of Food in the Last Year: Never true  Transportation Needs: No Transportation Needs (02/28/2023)   PRAPARE - Administrator, Civil Service (Medical): No    Lack of Transportation (Non-Medical): No  Physical Activity: Insufficiently Active (02/28/2023)   Exercise Vital Sign  Days of Exercise per Week: 1 day    Minutes of Exercise per Session: 30 min  Stress: No Stress Concern Present (02/28/2023)   [employer] of Occupational Health - Occupational Stress Questionnaire    Feeling of Stress : Not at all  Social Connections: Unknown (02/28/2023)   Social Connection and Isolation Panel [NHANES]    Frequency of Communication with Friends and Family: Once a week    Frequency of Social Gatherings with Friends and Family: Three times a week    Attends Religious Services: Not on file    Active Member of Clubs or Organizations: Yes    Attends Banker Meetings: More than 4 times per year    Marital  Status: Married    Tobacco Counseling Counseling given: Not Answered   Clinical Intake:  Pre-visit preparation completed: Yes  Pain : No/denies pain  Nutritional Risks: None Diabetes: No  How often do you need to have someone help you when you read instructions, pamphlets, or other written materials from your doctor or pharmacy?: 1 - Never   Activities of Daily Living    02/28/2023    6:04 PM  In your present state of health, do you have any difficulty performing the following activities:  Hearing? 1  Vision? 0  Difficulty concentrating or making decisions? 0  Walking or climbing stairs? 0  Dressing or bathing? 0  Doing errands, shopping? 0  Preparing Food and eating ? N  Using the Toilet? N  In the past six months, have you accidently leaked urine? Y  Do you have problems with loss of bowel control? N  Managing your Medications? N  Managing your Finances? N  Housekeeping or managing your Housekeeping? N    Patient Care Team: Sharlene Dory, DO as PCP - General (Family Medicine)  Indicate any recent Medical Services you may have received from other than [facility] providers in the past year (date may be approximate).     Assessment:   This is a routine wellness examination for John Zamora.  Hearing/Vision screen No results found.  Dietary issues and exercise activities discussed: Exercise limited by: None identified   Goals Addressed   None    Depression Screen    03/07/2023    9:44 AM 05/03/2022    8:57 AM 04/29/2021    8:50 AM 04/27/2020    9:36 AM  PHQ 2/9 Scores  PHQ - 2 Score 0 0 0 0  PHQ- 9 Score  0      Fall Risk    02/28/2023    6:04 PM 05/03/2022    8:56 AM 04/29/2021    9:03 AM  Fall Risk   Falls in the past year? 0 0 0  Number falls in past yr: 0 0 0  Injury with Fall? 0 0 0  Risk for fall due to : No Fall Risks No Fall Risks   Follow up Falls evaluation completed Falls evaluation completed     FALL RISK PREVENTION PERTAINING TO THE  HOME:  Any stairs in or around the home? Yes  If so, are there any without handrails? No  Home free of loose throw rugs in walkways, pet beds, electrical cords, etc? Yes  Adequate lighting in your home to reduce risk of falls? Yes   ASSISTIVE DEVICES UTILIZED TO PREVENT FALLS:  Life alert? No  Use of a cane, walker or w/c? No  Grab bars in the bathroom? No  Shower chair or bench in shower? No  Elevated  toilet seat or a handicapped toilet? No   TIMED UP AND GO:  Was the test performed?  No, audio visit .    Cognitive Function:        03/07/2023    9:46 AM  6CIT Screen  What Year? 0 points  What month? 0 points  What time? 0 points  Count back from 20 0 points  Months in reverse 0 points  Repeat phrase 0 points  Total Score 0 points    Immunizations Immunization History  Administered Date(s) Administered   Fluad Quad(high Dose 65+) 09/18/2022   Influenza-Unspecified 07/03/2019   PFIZER(Purple Top)SARS-COV-2 Vaccination 11/07/2019, 11/28/2019   PNEUMOCOCCAL CONJUGATE-20 05/03/2022   PPD Test 05/10/2020   Pneumococcal Polysaccharide-23 09/30/2018   Tdap 09/30/2018    TDAP status: Up to date  Flu Vaccine status: Up to date  Pneumococcal vaccine status: Up to date  Covid-19 vaccine status: Information provided on how to obtain vaccines.   Qualifies for Shingles Vaccine? Yes   Zostavax completed No   Shingrix Completed?: No.    Education has been provided regarding the importance of this vaccine. Patient has been advised to call insurance company to determine out of pocket expense if they have not yet received this vaccine. Advised may also receive vaccine at local pharmacy or Health Dept. Verbalized acceptance and understanding.  Screening Tests Health Maintenance  Topic Date Due   Zoster Vaccines- Shingrix (1 of 2) Never done   COVID-19 Vaccine (3 - Pfizer risk series) 12/26/2019   INFLUENZA VACCINE  05/03/2023   Medicare Annual Wellness (AWV)  03/06/2024    Fecal DNA (Cologuard)  05/13/2024   DTaP/Tdap/Td (2 - Td or Tdap) 09/30/2028   Pneumonia Vaccine 32+ Years old  Completed   Hepatitis C Screening  Completed   HPV VACCINES  Aged Out   Colonoscopy  Discontinued    Health Maintenance  Health Maintenance Due  Topic Date Due   Zoster Vaccines- Shingrix (1 of 2) Never done   COVID-19 Vaccine (3 - Pfizer risk series) 12/26/2019    Colorectal cancer screening: Type of screening: Cologuard. Completed 05/13/21. Repeat every 3 years  Lung Cancer Screening: (Low Dose CT Chest recommended if Age 83-80 years, 30 pack-year currently smoking OR have quit w/in 15years.) does not qualify.    Additional Screening:  Hepatitis C Screening: does qualify; Completed 04/22/19  Vision Screening: Recommended annual ophthalmology exams for early detection of glaucoma and other disorders of the eye. Is the patient up to date with their annual eye exam?  No  Who is the provider or what is the name of the office in which the patient attends annual eye exams? Dr. Tobias Alexander If pt is not established with a provider, would they like to be referred to a provider to establish care? No .   Dental Screening: Recommended annual dental exams for proper oral hygiene  Community Resource Referral / Chronic Care Management: CRR required this visit?  No   CCM required this visit?  No      Plan:     I have personally reviewed and noted the following in the patient's chart:   Medical and social history Use of alcohol, tobacco or illicit drugs  Current medications and supplements including opioid prescriptions. Patient is not currently taking opioid prescriptions. Functional ability and status Nutritional status Physical activity Advanced directives List of other physicians Hospitalizations, surgeries, and ER visits in previous 12 months Vitals Screenings to include cognitive, depression, and falls Referrals and appointments  In addition, I have reviewed  and discussed with patient certain preventive protocols, quality metrics, and best practice recommendations. A written personalized care plan for preventive services as well as general preventive health recommendations were provided to patient.   Due to this being a telephonic visit, the after visit summary with patients personalized plan was offered to patient via mail or my-chart. Patient would like to access on my-chart.  Donne Anon, [place]   03/07/2023   Nurse Notes: None

## 2023-05-07 ENCOUNTER — Encounter: Payer: Self-pay | Admitting: Family Medicine

## 2023-05-07 ENCOUNTER — Ambulatory Visit (INDEPENDENT_AMBULATORY_CARE_PROVIDER_SITE_OTHER): Payer: Medicare PPO | Admitting: Family Medicine

## 2023-05-07 VITALS — BP 120/78 | HR 69 | Temp 98.5°F | Ht 63.0 in | Wt 164.5 lb

## 2023-05-07 DIAGNOSIS — M542 Cervicalgia: Secondary | ICD-10-CM | POA: Diagnosis not present

## 2023-05-07 DIAGNOSIS — S46819A Strain of other muscles, fascia and tendons at shoulder and upper arm level, unspecified arm, initial encounter: Secondary | ICD-10-CM

## 2023-05-07 DIAGNOSIS — Z0001 Encounter for general adult medical examination with abnormal findings: Secondary | ICD-10-CM

## 2023-05-07 DIAGNOSIS — Z0000 Encounter for general adult medical examination without abnormal findings: Secondary | ICD-10-CM

## 2023-05-07 DIAGNOSIS — E785 Hyperlipidemia, unspecified: Secondary | ICD-10-CM | POA: Diagnosis not present

## 2023-05-07 LAB — COMPREHENSIVE METABOLIC PANEL
ALT: 17 U/L (ref 0–53)
AST: 16 U/L (ref 0–37)
Albumin: 4.4 g/dL (ref 3.5–5.2)
Alkaline Phosphatase: 56 U/L (ref 39–117)
BUN: 16 mg/dL (ref 6–23)
CO2: 30 mEq/L (ref 19–32)
Calcium: 9.4 mg/dL (ref 8.4–10.5)
Chloride: 101 mEq/L (ref 96–112)
Creatinine, Ser: 0.83 mg/dL (ref 0.40–1.50)
GFR: 88.24 mL/min (ref >60.00)
Glucose, Bld: 89 mg/dL (ref 70–99)
Potassium: 4.7 mEq/L (ref 3.5–5.1)
Sodium: 139 mEq/L (ref 135–145)
Total Bilirubin: 0.7 mg/dL (ref 0.2–1.2)
Total Protein: 7 g/dL (ref 6.0–8.3)

## 2023-05-07 LAB — LIPID PANEL
Cholesterol: 261 mg/dL — ABNORMAL HIGH (ref 0–200)
HDL: 44.2 mg/dL (ref >39.00)
LDL Cholesterol: 181 mg/dL — ABNORMAL HIGH (ref 0–99)
NonHDL: 216.62
Total CHOL/HDL Ratio: 6
Triglycerides: 176 mg/dL — ABNORMAL HIGH (ref 0.0–149.0)
VLDL: 35.2 mg/dL (ref 0.0–40.0)

## 2023-05-07 NOTE — Patient Instructions (Addendum)
Give [place] 2-3 business days to get the results of your labs back.   Keep the diet clean and stay active.  I recommend getting the flu shot in mid October. This suggestion would change if the CDC comes out with a different recommendation.   Please get me a copy of your advanced directive form at your convenience.   The Shingrix vaccine (for shingles) is a 2 shot series spaced 2-6 months apart. It can make people feel low energy, achy and almost like they have the flu for 48 hours after injection. 1/5 people can have nausea and/or vomiting. Please plan accordingly when deciding on when to get this shot. Call your pharmacy to get this. The second shot of the series is less severe regarding the side effects, but it still lasts 48 hours.   Heat (pad or rice pillow in microwave) over affected area, 10-15 minutes twice daily.   Ice/cold pack over area for 10-15 min twice daily.  OK to take Tylenol 1000 mg (2 extra strength tabs) or 975 mg (3 regular strength tabs) every 6 hours as needed.  Send me a message in 4-6 weeks if no improvement with the shoulder/neck area.   Let us know if you need anything.  Trapezius stretches/exercises Do exercises exactly as told by your health care provider and adjust them as directed. It is normal to feel mild stretching, pulling, tightness, or discomfort as you do these exercises, but you should stop right away if you feel sudden pain or your pain gets worse.   Stretching and range of motion exercises These exercises warm up your muscles and joints and improve the movement and flexibility of your shoulder. These exercises can also help to relieve pain, numbness, and tingling. If you are unable to do any of the following for any reason, do not further attempt to do it.   Exercise A: Flexion, standing     Stand and hold a broomstick, a cane, or a similar object. Place your hands a little more than shoulder-width apart on the object. Your left / right hand should be  palm-up, and your other hand should be palm-down. Push the stick to raise your left / right arm out to your side and then over your head. Use your other hand to help move the stick. Stop when you feel a stretch in your shoulder, or when you reach the angle that is recommended by your health care provider. Avoid shrugging your shoulder while you raise your arm. Keep your shoulder blade tucked down toward your spine. Hold for 30 seconds. Slowly return to the starting position. Repeat 2 times. Complete this exercise 3 times per week.  Exercise B: Abduction, supine     Lie on your back and hold a broomstick, a cane, or a similar object. Place your hands a little more than shoulder-width apart on the object. Your left / right hand should be palm-up, and your other hand should be palm-down. Push the stick to raise your left / right arm out to your side and then over your head. Use your other hand to help move the stick. Stop when you feel a stretch in your shoulder, or when you reach the angle that is recommended by your health care provider. Avoid shrugging your shoulder while you raise your arm. Keep your shoulder blade tucked down toward your spine. Hold for 30 seconds. Slowly return to the starting position. Repeat 2 times. Complete this exercise 3 times per week.  Exercise C: Flexion,  active-assisted     Lie on your back. You may bend your knees for comfort. Hold a broomstick, a cane, or a similar object. Place your hands about shoulder-width apart on the object. Your palms should face toward your feet. Raise the stick and move your arms over your head and behind your head, toward the floor. Use your healthy arm to help your left / right arm move farther. Stop when you feel a gentle stretch in your shoulder, or when you reach the angle where your health care provider tells you to stop. Hold for 30 seconds. Slowly return to the starting position. Repeat 2 times. Complete this exercise 3 times  per week.  Exercise D: External rotation and abduction     Stand in a door frame with one of your feet slightly in front of the other. This is called a staggered stance. Choose one of the following positions as told by your health care provider: Place your hands and forearms on the door frame above your head. Place your hands and forearms on the door frame at the height of your head. Place your hands on the door frame at the height of your elbows. Slowly move your weight onto your front foot until you feel a stretch across your chest and in the front of your shoulders. Keep your head and chest upright and keep your abdominal muscles tight. Hold for 30 seconds. To release the stretch, shift your weight to your back foot. Repeat 2 times. Complete this stretch 3 times per week.  Strengthening exercises These exercises build strength and endurance in your shoulder. Endurance is the ability to use your muscles for a long time, even after your muscles get tired. Exercise E: Scapular depression and adduction  Sit on a stable chair. Support your arms in front of you with pillows, armrests, or a tabletop. Keep your elbows in line with the sides of your body. Gently move your shoulder blades down toward your middle back. Relax the muscles on the tops of your shoulders and in the back of your neck. Hold for 3 seconds. Slowly release the tension and relax your muscles completely before doing this exercise again. Repeat for a total of 10 repetitions. After you have practiced this exercise, try doing the exercise without the arm support. Then, try the exercise while standing instead of sitting. Repeat 2 times. Complete this exercise 3 times per week.  Exercise F: Shoulder abduction, isometric     Stand or sit about 4-6 inches (10-15 cm) from a wall with your left / right side facing the wall. Bend your left / right elbow and gently press your elbow against the wall. Increase the pressure slowly until  you are pressing as hard as you can without shrugging your shoulder. Hold for 3 seconds. Slowly release the tension and relax your muscles completely. Repeat for a total of 10 repetitions. Repeat 2 times. Complete this exercise 3 times per week.  Exercise G: Shoulder flexion, isometric     Stand or sit about 4-6 inches (10-15 cm) away from a wall with your left / right side facing the wall. Keep your left / right elbow straight and gently press the top of your fist against the wall. Increase the pressure slowly until you are pressing as hard as you can without shrugging your shoulder. Hold for 10-15 seconds. Slowly release the tension and relax your muscles completely. Repeat for a total of 10 repetitions. Repeat 2 times. Complete this exercise 3 times per  week.  Exercise H: Internal rotation     Sit in a stable chair without armrests, or stand. Secure an exercise band at your left / right side, at elbow height. Place a soft object, such as a folded towel or a small pillow, under your left / right upper arm so your elbow is a few inches (about 8 cm) away from your side. Hold the end of the exercise band so the band stretches. Keeping your elbow pressed against the soft object under your arm, move your forearm across your body toward your abdomen. Keep your body steady so the movement is only coming from your shoulder. Hold for 3 seconds. Slowly return to the starting position. Repeat for a total of 10 repetitions. Repeat 2 times. Complete this exercise 3 times per week.  Exercise I: External rotation     Sit in a stable chair without armrests, or stand. Secure an exercise band at your left / right side, at elbow height. Place a soft object, such as a folded towel or a small pillow, under your left / right upper arm so your elbow is a few inches (about 8 cm) away from your side. Hold the end of the exercise band so the band stretches. Keeping your elbow pressed against the soft object  under your arm, move your forearm out, away from your abdomen. Keep your body steady so the movement is only coming from your shoulder. Hold for 3 seconds. Slowly return to the starting position. Repeat for a total of 10 repetitions. Repeat 2 times. Complete this exercise 3 times per week. Exercise J: Shoulder extension  Sit in a stable chair without armrests, or stand. Secure an exercise band to a stable object in front of you so the band is at shoulder height. Hold one end of the exercise band in each hand. Your palms should face each other. Straighten your elbows and lift your hands up to shoulder height. Step back, away from the secured end of the exercise band, until the band stretches. Squeeze your shoulder blades together and pull your hands down to the sides of your thighs. Stop when your hands are straight down by your sides. Do not let your hands go behind your body. Hold for 3 seconds. Slowly return to the starting position. Repeat for a total of 10 repetitions. Repeat 2 times. Complete this exercise 3 times per week.  Exercise K: Shoulder extension, prone     Lie on your abdomen on a firm surface so your left / right arm hangs over the edge. Hold a 5 lb weight in your hand so your palm faces in toward your body. Your arm should be straight. Squeeze your shoulder blade down toward the middle of your back. Slowly raise your arm behind you, up to the height of the surface that you are lying on. Keep your arm straight. Hold for 3 seconds. Slowly return to the starting position and relax your muscles. Repeat for a total of 10 repetitions. Repeat 2 times. Complete this exercise 3 times per week.   Exercise L: Horizontal abduction, prone  Lie on your abdomen on a firm surface so your left / right arm hangs over the edge. Hold a 5 lb weight in your hand so your palm faces toward your feet. Your arm should be straight. Squeeze your shoulder blade down toward the middle of your  back. Bend your elbow so your hand moves up, until your elbow is bent to an "L" shape (90 degrees).  With your elbow bent, slowly move your forearm forward and up. Raise your hand up to the height of the surface that you are lying on. Your upper arm should not move, and your elbow should stay bent. At the top of the movement, your palm should face the floor. Hold for 3 seconds. Slowly return to the starting position and relax your muscles. Repeat for a total of 10 repetitions. Repeat 2 times. Complete this exercise 3 times per week.  Exercise M: Horizontal abduction, standing  Sit on a stable chair, or stand. Secure an exercise band to a stable object in front of you so the band is at shoulder height. Hold one end of the exercise band in each hand. Straighten your elbows and lift your hands straight in front of you, up to shoulder height. Your palms should face down, toward the floor. Step back, away from the secured end of the exercise band, until the band stretches. Move your arms out to your sides, and keep your arms straight. Hold for 3 seconds. Slowly return to the starting position. Repeat for a total of 10 repetitions. Repeat 2 times. Complete this exercise 3 times per week.  Exercise N: Scapular retraction and elevation  Sit on a stable chair, or stand. Secure an exercise band to a stable object in front of you so the band is at shoulder height. Hold one end of the exercise band in each hand. Your palms should face each other. Sit in a stable chair without armrests, or stand. Step back, away from the secured end of the exercise band, until the band stretches. Squeeze your shoulder blades together and lift your hands over your head. Keep your elbows straight. Hold for 3 seconds. Slowly return to the starting position. Repeat for a total of 10 repetitions. Repeat 2 times. Complete this exercise 3 times per week.  This information is not intended to replace advice given to you by  your health care provider. Make sure you discuss any questions you have with your health care provider. Document Released: 09/18/2005 Document Revised: 05/25/2016 Document Reviewed: 08/05/2015 [employer] Interactive Patient Education  2017 [employer]  EXERCISES RANGE OF MOTION (ROM) AND STRETCHING EXERCISES  These exercises may help you when beginning to rehabilitate your issue. In order to successfully resolve your symptoms, you must improve your posture. These exercises are designed to help reduce the forward-head and rounded-shoulder posture which contributes to this condition. Your symptoms may resolve with or without further involvement from your physician, physical therapist or athletic trainer. While completing these exercises, remember:  Restoring tissue flexibility helps normal motion to return to the joints. This allows healthier, less painful movement and activity. An effective stretch should be held for at least 20 seconds, although you may need to begin with shorter hold times for comfort. A stretch should never be painful. You should only feel a gentle lengthening or release in the stretched tissue. Do not do any stretch or exercise that you cannot tolerate.  STRETCH- Axial Extensors Lie on your back on the floor. You may bend your knees for comfort. Place a rolled-up hand towel or dish towel, about 2 inches in diameter, under the part of your head that makes contact with the floor. Gently tuck your chin, as if trying to make a "double chin," until you feel a gentle stretch at the base of your head. Hold 15-20 seconds. Repeat 2-3 times. Complete this exercise 1 time per day.   STRETCH - Axial Extension  Stand  or sit on a firm surface. Assume a good posture: chest up, shoulders drawn back, abdominal muscles slightly tense, knees unlocked (if standing) and feet hip width apart. Slowly retract your chin so your head slides back and your chin slightly lowers. Continue to look straight  ahead. You should feel a gentle stretch in the back of your head. Be certain not to feel an aggressive stretch since this can cause headaches later. Hold for 15-20 seconds. Repeat 2-3 times. Complete this exercise 1 time per day.  STRETCH - Cervical Side Bend  Stand or sit on a firm surface. Assume a good posture: chest up, shoulders drawn back, abdominal muscles slightly tense, knees unlocked (if standing) and feet hip width apart. Without letting your nose or shoulders move, slowly tip your right / left ear to your shoulder until your feel a gentle stretch in the muscles on the opposite side of your neck. Hold 15-20 seconds. Repeat 2-3 times. Complete this exercise 1-2 times per day.  STRETCH - Cervical Rotators  Stand or sit on a firm surface. Assume a good posture: chest up, shoulders drawn back, abdominal muscles slightly tense, knees unlocked (if standing) and feet hip width apart. Keeping your eyes level with the ground, slowly turn your head until you feel a gentle stretch along the back and opposite side of your neck. Hold 15-20 seconds. Repeat 2-3 times. Complete this exercise 1-2 times per day.  RANGE OF MOTION - Neck Circles  Stand or sit on a firm surface. Assume a good posture: chest up, shoulders drawn back, abdominal muscles slightly tense, knees unlocked (if standing) and feet hip width apart. Gently roll your head down and around from the back of one shoulder to the back of the other. The motion should never be forced or painful. Repeat the motion 10-20 times, or until you feel the neck muscles relax and loosen. Repeat 2-3 times. Complete the exercise 1-2 times per day. STRENGTHENING EXERCISES - Cervical Strain and Sprain These exercises may help you when beginning to rehabilitate your injury. They may resolve your symptoms with or without further involvement from your physician, physical therapist, or athletic trainer. While completing these exercises, remember:  Muscles  can gain both the endurance and the strength needed for everyday activities through controlled exercises. Complete these exercises as instructed by your physician, physical therapist, or athletic trainer. Progress the resistance and repetitions only as guided. You may experience muscle soreness or fatigue, but the pain or discomfort you are trying to eliminate should never worsen during these exercises. If this pain does worsen, stop and make certain you are following the directions exactly. If the pain is still present after adjustments, discontinue the exercise until you can discuss the trouble with your clinician.  STRENGTH - Cervical Flexors, Isometric Face a wall, standing about 6 inches away. Place a small pillow, a ball about 6-8 inches in diameter, or a folded towel between your forehead and the wall. Slightly tuck your chin and gently push your forehead into the soft object. Push only with mild to moderate intensity, building up tension gradually. Keep your jaw and forehead relaxed. Hold 10 to 20 seconds. Keep your breathing relaxed. Release the tension slowly. Relax your neck muscles completely before you start the next repetition. Repeat 2-3 times. Complete this exercise 1 time per day.  STRENGTH- Cervical Lateral Flexors, Isometric  Stand about 6 inches away from a wall. Place a small pillow, a ball about 6-8 inches in diameter, or a folded towel  between the side of your head and the wall. Slightly tuck your chin and gently tilt your head into the soft object. Push only with mild to moderate intensity, building up tension gradually. Keep your jaw and forehead relaxed. Hold 10 to 20 seconds. Keep your breathing relaxed. Release the tension slowly. Relax your neck muscles completely before you start the next repetition. Repeat 2-3 times. Complete this exercise 1 time per day.  STRENGTH - Cervical Extensors, Isometric  Stand about 6 inches away from a wall. Place a small pillow, a ball  about 6-8 inches in diameter, or a folded towel between the back of your head and the wall. Slightly tuck your chin and gently tilt your head back into the soft object. Push only with mild to moderate intensity, building up tension gradually. Keep your jaw and forehead relaxed. Hold 10 to 20 seconds. Keep your breathing relaxed. Release the tension slowly. Relax your neck muscles completely before you start the next repetition. Repeat 2-3 times. Complete this exercise 1 time per day.  POSTURE AND BODY MECHANICS CONSIDERATIONS Keeping correct posture when sitting, standing or completing your activities will reduce the stress put on different body tissues, allowing injured tissues a chance to heal and limiting painful experiences. The following are general guidelines for improved posture. Your physician or physical therapist will provide you with any instructions specific to your needs. While reading these guidelines, remember: The exercises prescribed by your provider will help you have the flexibility and strength to maintain correct postures. The correct posture provides the optimal environment for your joints to work. All of your joints have less wear and tear when properly supported by a spine with good posture. This means you will experience a healthier, less painful body. Correct posture must be practiced with all of your activities, especially prolonged sitting and standing. Correct posture is as important when doing repetitive low-stress activities (typing) as it is when doing a single heavy-load activity (lifting).  PROLONGED STANDING WHILE SLIGHTLY LEANING FORWARD When completing a task that requires you to lean forward while standing in one place for a long time, place either foot up on a stationary 2- to 4-inch high object to help maintain the best posture. When both feet are on the ground, the low back tends to lose its slight inward curve. If this curve flattens (or becomes too large), then  the back and your other joints will experience too much stress, fatigue more quickly, and can cause pain.   RESTING POSITIONS Consider which positions are most painful for you when choosing a resting position. If you have pain with flexion-based activities (sitting, bending, stooping, squatting), choose a position that allows you to rest in a less flexed posture. You would want to avoid curling into a fetal position on your side. If your pain worsens with extension-based activities (prolonged standing, working overhead), avoid resting in an extended position such as sleeping on your stomach. Most people will find more comfort when they rest with their spine in a more neutral position, neither too rounded nor too arched. Lying on a non-sagging bed on your side with a pillow between your knees, or on your back with a pillow under your knees will often provide some relief. Keep in mind, being in any one position for a prolonged period of time, no matter how correct your posture, can still lead to stiffness.  WALKING Walk with an upright posture. Your ears, shoulders, and hips should all line up. OFFICE WORK When working at  a desk, create an environment that supports good, upright posture. Without extra support, muscles fatigue and lead to excessive strain on joints and other tissues.  CHAIR: A chair should be able to slide under your desk when your back makes contact with the back of the chair. This allows you to work closely. The chair's height should allow your eyes to be level with the upper part of your monitor and your hands to be slightly lower than your elbows. Body position: Your feet should make contact with the floor. If this is not possible, use a foot rest. Keep your ears over your shoulders. This will reduce stress on your neck and low back.

## 2023-05-07 NOTE — Progress Notes (Signed)
Chief Complaint  Patient presents with   Annual Exam    Well Male John Zamora is here for a complete physical.   His last physical was >1 year ago.  Current diet: in general, a "healthy" diet.   Current exercise: hiking, active in yard Weight trend: stable Fatigue out of ordinary? No. Seat belt? Yes.   Advanced directive? No  Health maintenance Tetanus- Yes CCS- Yes Shingrix- No HIV- Yes Hep C- Yes  Patient reports decreased range of motion and stiffness in his neck over the past 4 to 5 months.  No specific injury or change in activity.  He does not do any dedicated neck/shoulder stretches/activities.  No neurologic signs or symptoms, bruising, redness, or swelling.  He has not tried anything at home so far.  Past Medical History:  Diagnosis Date   Allergy 03/01/2017   Cataract June 2021   Surgery June 26/July 10   Hemorrhoids      Past Surgical History:  Procedure Laterality Date   ACHILLES TENDON REPAIR Right    CHOLECYSTECTOMY N/A 03/01/2017   Procedure: LAPAROSCOPIC CHOLECYSTECTOMY WITH INTRAOPERATIVE CHOLANGIOGRAM;  Surgeon: John Kelp, MD;  Location: [facility];  Service: General;  Laterality: N/A;    Medications  Current Outpatient Medications on File Prior to Visit  Medication Sig Dispense Refill   saw palmetto 160 MG capsule Take 160 mg by mouth 2 (two) times daily.      Allergies Allergies  Allergen Reactions   Ceftriaxone Rash    Family History Family History  Problem Relation Age of Onset   Alzheimer's disease Mother    Cancer Father        lung cancer (life long smoker) and pancreatic   Hyperlipidemia Brother    Depression Maternal Grandmother     Review of Systems: Constitutional: no fevers or chills Eye:  no recent significant change in vision Ear/Nose/Mouth/Throat:  Ears:  no hearing loss Nose/Mouth/Throat:  no complaints of nasal congestion, no sore throat Cardiovascular:  no chest pain Respiratory:  no shortness of  breath Gastrointestinal:  no abdominal pain, no change in bowel habits GU:  Male: negative for dysuria, frequency, and incontinence Musculoskeletal/Extremities:  no pain of the joints Integumentary (Skin/Breast):  no abnormal skin lesions reported Neurologic:  no headaches Endocrine: No unexpected weight changes Hematologic/Lymphatic:  no night sweats  Exam BP 120/78 (BP Location: Left Arm, Patient Position: Sitting, Cuff Size: Normal)   Pulse 69   Temp 98.5 F (36.9 C) (Oral)   Ht 5\' 3"  (1.6 m)   Wt 164 lb 8 oz (74.6 kg)   SpO2 97%   BMI 29.14 kg/m  General:  well developed, well nourished, in no apparent distress Skin:  no significant moles, warts, or growths Head:  no masses, lesions, or tenderness Eyes:  pupils equal and round, sclera anicteric without injection Ears:  canals without lesions, TMs shiny without retraction, no obvious effusion, no erythema Nose:  nares patent, mucosa normal Throat/Pharynx:  lips and gingiva without lesion; tongue and uvula midline; non-inflamed pharynx; no exudates or postnasal drainage Neck: neck supple without adenopathy, thyromegaly, or masses Lungs:  clear to auscultation, breath sounds equal bilaterally, no respiratory distress Cardio:  regular rate and rhythm, no bruits, no LE edema Abdomen:  abdomen soft, nontender; bowel sounds normal; no masses or organomegaly Rectal: Deferred Musculoskeletal: TTP over the bilateral trapezius musculature and lateral neck musculature bilaterally.  Some hypertonicity appreciated.  Symmetrical muscle groups noted without atrophy or deformity Extremities:  no clubbing, cyanosis, or edema,  no deformities, no skin discoloration Neuro:  gait normal; deep tendon reflexes normal and symmetric Psych: well oriented with normal range of affect and appropriate judgment/insight  Assessment and Plan  Well adult exam  Hyperlipidemia, unspecified hyperlipidemia type - Plan: Comprehensive metabolic panel, Lipid  panel  Neck pain  Strain of trapezius muscle, unspecified laterality, initial encounter   Well 72 y.o. male. Counseled on diet and exercise. Shingrix rec'd.  Advanced directive form requested today.  Other orders as above. Neck/trapezius pain: Heat, ice, Tylenol, stretches and exercises.  If no improvement the next 4 to 6 weeks, he will let us know and we will set him up with physical therapy. Follow up in 1 yr pending the above workup. The patient voiced understanding and agreement to the plan.  John Roche [name], DO 05/07/23 9:35 AM

## 2023-05-28 ENCOUNTER — Encounter: Payer: Self-pay | Admitting: Family Medicine

## 2023-06-13 ENCOUNTER — Ambulatory Visit (INDEPENDENT_AMBULATORY_CARE_PROVIDER_SITE_OTHER): Payer: Medicare PPO | Admitting: Family Medicine

## 2023-06-13 ENCOUNTER — Encounter: Payer: Self-pay | Admitting: Family Medicine

## 2023-06-13 VITALS — BP 114/84 | HR 75 | Temp 98.9°F | Ht 63.0 in | Wt 162.5 lb

## 2023-06-13 DIAGNOSIS — K644 Residual hemorrhoidal skin tags: Secondary | ICD-10-CM

## 2023-06-13 DIAGNOSIS — K625 Hemorrhage of anus and rectum: Secondary | ICD-10-CM | POA: Diagnosis not present

## 2023-06-13 NOTE — Patient Instructions (Signed)
Give [place] 2-3 business days to get the results of your labs back.   Since you aren't bleeding and have a normal Cologard 2 years ago, we can hold off on seeing the gastroenterology team if labs are normal.   If you would like to see one, let me know.   Stay hydrated.   Let us know if you need anything.

## 2023-06-13 NOTE — Progress Notes (Signed)
Chief Complaint  Patient presents with   Hemorrhoids    Significant bleeding     Subjective: Patient is a 72 y.o. male here for rectal bleeding.  2 weeks ago, started having rectal bleeding. Back to 1990 he has had BRBPR w defecation. Usually it is short lived and resolves on own. Cologard was neg 2 yrs ago. He doe have a hemorrhoid. Having some lightheadedness intermittently. .  No rectal itching, pain, abd pain, tarry stools, weight changes. Stool was looser around the time it happened. No trauma to the area.   Past Medical History:  Diagnosis Date   Allergy 03/01/2017   Cataract June 2021   Surgery June 26/July 10   Hemorrhoids     Objective: BP 114/84 (BP Location: Left Arm, Patient Position: Sitting, Cuff Size: Normal)   Pulse 75   Temp 98.9 F (37.2 C) (Oral)   Ht 5\' 3"  (1.6 m)   Wt 162 lb 8 oz (73.7 kg)   SpO2 96%   BMI 28.79 kg/m  General: Awake, appears stated age Heart: RRR, no LE edema Abdomen: Bowel sounds present, soft, nontender, nondistended Rectal: No external lesions other than a nonthrombosed external hemorrhoid over the left portion of the anal opening.  No signs of bleeding. Lungs: CTAB, no rales, wheezes or rhonchi. No accessory muscle use Psych: Age appropriate judgment and insight, normal affect and mood  Assessment and Plan: BRBPR (bright red blood per rectum) - Plan: CBC  External hemorrhoid  Check CBC.  If hemoglobin is low, will refer to gastroenterology.  He is not having any bleeding in the last 13 days.  Stools are normal.  With the evidence of a hemorrhoid and his history of this, would lean towards watchful waiting if his labs are normal.  Stay hydrated. The patient voiced understanding and agreement to the plan.  Jilda Roche [place], DO 06/13/23  4:41 PM

## 2023-06-14 LAB — CBC
HCT: 45.8 % (ref 39.0–52.0)
Hemoglobin: 15.1 g/dL (ref 13.0–17.0)
MCHC: 32.9 g/dL (ref 30.0–36.0)
MCV: 89.8 fl (ref 78.0–100.0)
Platelets: 274 10*3/uL (ref 150.0–400.0)
RBC: 5.1 Mil/uL (ref 4.22–5.81)
RDW: 13.2 % (ref 11.5–15.5)
WBC: 7.2 10*3/uL (ref 4.0–10.5)

## 2024-03-12 ENCOUNTER — Ambulatory Visit (INDEPENDENT_AMBULATORY_CARE_PROVIDER_SITE_OTHER)

## 2024-03-12 VITALS — BP 114/84 | Ht 63.0 in | Wt 161.0 lb

## 2024-03-12 DIAGNOSIS — Z2821 Immunization not carried out because of patient refusal: Secondary | ICD-10-CM | POA: Diagnosis not present

## 2024-03-12 DIAGNOSIS — Z0000 Encounter for general adult medical examination without abnormal findings: Secondary | ICD-10-CM

## 2024-03-12 NOTE — Progress Notes (Signed)
 Because this visit was a virtual/telehealth visit,  certain criteria was not obtained, such a blood pressure, CBG if applicable, and timed get up and go. Any medications not marked as taking were not mentioned during the medication reconciliation part of the visit. Any vitals not documented were not able to be obtained due to this being a telehealth visit or patient was unable to self-report a recent blood pressure reading due to a lack of equipment at home via telehealth. Vitals that have been documented are verbally provided by the patient.   This visit was performed by a medical professional under my direct supervision. I was immediately available for consultation/collaboration. I have reviewed and agree with the Annual Wellness Visit documentation.  Subjective:   John Zamora is a 73 y.o. who presents for a Medicare Wellness preventive visit.  As a reminder, Annual Wellness Visits don't include a physical exam, and some assessments may be limited, especially if this visit is performed virtually. We may recommend an in-person follow-up visit with your provider if needed.  Visit Complete: Virtual I connected with  John Zamora on 03/12/24 by a audio enabled telemedicine application and verified that I am speaking with the correct person using two identifiers.  Patient Location: Home  Provider Location: Home Office  I discussed the limitations of evaluation and management by telemedicine. The patient expressed understanding and agreed to proceed.  Vital Signs: Because this visit was a virtual/telehealth visit, some criteria may be missing or patient reported. Any vitals not documented were not able to be obtained and vitals that have been documented are patient reported.  VideoDeclined- This patient declined Librarian, academic. Therefore the visit was completed with audio only.  Persons Participating in Visit: Patient.  AWV Questionnaire: Yes: Patient Medicare  AWV questionnaire was completed by the patient on 03/05/2024; I have confirmed that all information answered by patient is correct and no changes since this date.  Cardiac Risk Factors include: advanced age (>41men, >45 women);male gender     Objective:     Today's Vitals   03/12/24 0930  BP: 114/84  Weight: 161 lb (73 kg)  Height: 5' 3 (1.6 m)   Body mass index is 28.52 kg/m.     03/12/2024    9:34 AM 03/07/2023    9:41 AM 02/28/2017   12:26 PM 02/28/2017    9:53 AM 02/28/2017    1:52 AM  Advanced Directives  Does Patient Have a Medical Advance Directive? No No No No No  Would patient like information on creating a medical advance directive? No - Patient declined No - Patient declined No - Patient declined Yes (ED - Information included in AVS) No - Patient declined    Current Medications (verified) Outpatient Encounter Medications as of 03/12/2024  Medication Sig   saw palmetto 160 MG capsule Take 160 mg by mouth 2 (two) times daily.   No facility-administered encounter medications on file as of 03/12/2024.    Allergies (verified) Ceftriaxone    History: Past Medical History:  Diagnosis Date   Allergy 03/01/2017   Cataract June 2021   Surgery June 26/July 10   Hemorrhoids    Past Surgical History:  Procedure Laterality Date   ACHILLES TENDON REPAIR Right    CHOLECYSTECTOMY N/A 03/01/2017   Procedure: LAPAROSCOPIC CHOLECYSTECTOMY WITH INTRAOPERATIVE CHOLANGIOGRAM;  Surgeon: Boyce Byes, MD;  Location: [facility];  Service: General;  Laterality: N/A;   Family History  Problem Relation Age of Onset   Alzheimer's disease Mother  Cancer Father        lung cancer (life long smoker) and pancreatic   Hyperlipidemia Brother    Depression Maternal Grandmother    Social History   Socioeconomic History   Marital status: Married    Spouse name: Not on file   Number of children: Not on file   Years of education: Not on file   Highest education level: Master's degree  (e.g., MA, MS, MEng, MEd, MSW, MBA)  Occupational History   Not on file  Tobacco Use   Smoking status: Never   Smokeless tobacco: Never  Vaping Use   Vaping status: Never Used  Substance and Sexual Activity   Alcohol use: Not Currently   Drug use: No   Sexual activity: Yes  Other Topics Concern   Not on file  Social History Narrative   Not on file   Social Drivers of Health   Financial Resource Strain: Low Risk  (03/05/2024)   Overall Financial Resource Strain (CARDIA)    Difficulty of Paying Living Expenses: Not hard at all  Food Insecurity: No Food Insecurity (03/05/2024)   Hunger Vital Sign    Worried About Running Out of Food in the Last Year: Never true    Ran Out of Food in the Last Year: Never true  Transportation Needs: No Transportation Needs (03/05/2024)   PRAPARE - Administrator, Civil Service (Medical): No    Lack of Transportation (Non-Medical): No  Physical Activity: Sufficiently Active (03/05/2024)   Exercise Vital Sign    Days of Exercise per Week: 2 days    Minutes of Exercise per Session: 120 min  Stress: No Stress Concern Present (03/05/2024)   [employer] of Occupational Health - Occupational Stress Questionnaire    Feeling of Stress : Not at all  Social Connections: Socially Integrated (03/05/2024)   Social Connection and Isolation Panel [NHANES]    Frequency of Communication with Friends and Family: More than three times a week    Frequency of Social Gatherings with Friends and Family: Twice a week    Attends Religious Services: More than 4 times per year    Active Member of [employer] or Organizations: Yes    Attends Engineer, structural: More than 4 times per year    Marital Status: Married    Tobacco Counseling Counseling given: Not Answered    Clinical Intake:  Pre-visit preparation completed: Yes  Pain : No/denies pain     BMI - recorded: 28.52 Nutritional Status: BMI 25 -29 Overweight Nutritional Risks:  None Diabetes: No  Lab Results  Component Value Date   HGBA1C 5.0 04/27/2020     How often do you need to have someone help you when you read instructions, pamphlets, or other written materials from your doctor or pharmacy?: 1 - Never What is the last grade level you completed in school?: Masters  Interpreter Needed?: No  Information entered by :: [employer]   Activities of Daily Living     03/05/2024    1:14 PM  In your present state of health, do you have any difficulty performing the following activities:  Hearing? 0  Vision? 0  Difficulty concentrating or making decisions? 0  Walking or climbing stairs? 0  Dressing or bathing? 0  Doing errands, shopping? 0  Preparing Food and eating ? N  Using the Toilet? N  In the past six months, have you accidently leaked urine? N  Do you have problems with loss of bowel control? N  Managing your Medications? N  Managing your Finances? N  Housekeeping or managing your Housekeeping? N    Patient Care Team: Jobe Mulder, DO as PCP - General (Family Medicine)  I have updated your Care Teams any recent Medical Services you may have received from other providers in the past year.     Assessment:    This is a routine wellness examination for John Zamora.  Hearing/Vision screen Hearing Screening - Comments:: Patient wears hearing aids a of Saturday  Vision Screening - Comments:: Patient wears readers   Goals Addressed               This Visit's Progress     Patient Stated (pt-stated)        Patient would like to walk 70 miles this summer        Depression Screen     03/12/2024    9:34 AM 05/07/2023    7:53 AM 03/07/2023    9:44 AM 05/03/2022    8:57 AM 04/29/2021    8:50 AM 04/27/2020    9:36 AM  PHQ 2/9 Scores  PHQ - 2 Score 0 0 0 0 0 0  PHQ- 9 Score 0 0  0      Fall Risk     03/05/2024    1:14 PM 05/07/2023    7:53 AM 02/28/2023    6:04 PM 05/03/2022    8:56 AM 04/29/2021    9:03 AM  Fall Risk    Falls in the past year? 0 0 0 0 0  Number falls in past yr: 0 0 0 0 0  Injury with Fall? 0 0 0 0 0  Risk for fall due to : No Fall Risks No Fall Risks No Fall Risks No Fall Risks   Follow up Falls evaluation completed Falls evaluation completed Falls evaluation completed Falls evaluation completed     MEDICARE RISK AT HOME:  Medicare Risk at Home Any stairs in or around the home?: (Patient-Rptd) Yes If so, are there any without handrails?: (Patient-Rptd) No Home free of loose throw rugs in walkways, pet beds, electrical cords, etc?: (Patient-Rptd) Yes Adequate lighting in your home to reduce risk of falls?: (Patient-Rptd) Yes Life alert?: (Patient-Rptd) No Use of a cane, walker or w/c?: (Patient-Rptd) No Grab bars in the bathroom?: (Patient-Rptd) No Shower chair or bench in shower?: (Patient-Rptd) No Elevated toilet seat or a handicapped toilet?: (Patient-Rptd) No  TIMED UP AND GO:  Was the test performed?  No  Cognitive Function: 6CIT completed        03/12/2024    9:32 AM 03/07/2023    9:46 AM  6CIT Screen  What Year? 0 points 0 points  What month? 0 points 0 points  What time? 0 points 0 points  Count back from 20 0 points 0 points  Months in reverse 0 points 0 points  Repeat phrase 0 points 0 points  Total Score 0 points 0 points    Immunizations Immunization History  Administered Date(s) Administered   Fluad Quad(high Dose 65+) 09/18/2022   Influenza-Unspecified 07/03/2019   PFIZER(Purple Top)SARS-COV-2 Vaccination 11/07/2019, 11/28/2019   PNEUMOCOCCAL CONJUGATE-20 05/03/2022   PPD Test 05/10/2020   Pneumococcal Polysaccharide-23 09/30/2018   Tdap 09/30/2018    Screening Tests Health Maintenance  Topic Date Due   Zoster Vaccines- Shingrix (1 of 2) Never done   COVID-19 Vaccine (3 - Pfizer risk series) 12/26/2019   INFLUENZA VACCINE  05/02/2024   Fecal DNA (Cologuard)  05/13/2024   Medicare  Annual Wellness (AWV)  03/12/2025   DTaP/Tdap/Td (2 - Td or  Tdap) 09/30/2028   Pneumonia Vaccine 72+ Years old  Completed   Hepatitis C Screening  Completed   HPV VACCINES  Aged Out   Meningococcal B Vaccine  Aged Out    Health Maintenance  Health Maintenance Due  Topic Date Due   Zoster Vaccines- Shingrix (1 of 2) Never done   COVID-19 Vaccine (3 - Pfizer risk series) 12/26/2019   Health Maintenance Items Addressed:patient declined vaccinations   Additional Screening:  Vision Screening: Recommended annual ophthalmology exams for early detection of glaucoma and other disorders of the eye. Would you like a referral to an eye doctor? No    Dental Screening: Recommended annual dental exams for proper oral hygiene  Community Resource Referral / Chronic Care Management: CRR required this visit?  No   CCM required this visit?  No   Plan:    I have personally reviewed and noted the following in the patient's chart:   Medical and social history Use of alcohol, tobacco or illicit drugs  Current medications and supplements including opioid prescriptions. Patient is not currently taking opioid prescriptions. Functional ability and status Nutritional status Physical activity Advanced directives List of other physicians Hospitalizations, surgeries, and ER visits in previous 12 months Vitals Screenings to include cognitive, depression, and falls Referrals and appointments  In addition, I have reviewed and discussed with patient certain preventive protocols, quality metrics, and best practice recommendations. A written personalized care plan for preventive services as well as general preventive health recommendations were provided to patient.   Freeda Jerry, [place]   03/12/2024   After Visit Summary: (MyChart) Due to this being a telephonic visit, the after visit summary with patients personalized plan was offered to patient via MyChart   Notes: Nothing significant to report at this time.

## 2024-03-12 NOTE — Patient Instructions (Signed)
 Mr. Shovlin , Thank you for taking time out of your busy schedule to complete your Annual Wellness Visit with me. I enjoyed our conversation and look forward to speaking with you again next year. I, as well as your care team,  appreciate your ongoing commitment to your health goals. Please review the following plan we discussed and let me know if I can assist you in the future. Your Game plan/ To Do List    Referrals: If you haven't heard from the office you've been referred to, please reach out to them at the phone provided.  none Follow up Visits: Next Medicare AWV with our clinical staff: 03/18/2025   Have you seen your provider in the last 6 months (3 months if uncontrolled diabetes)? No Next Office Visit with your provider: 05/09/2024  Clinician Recommendations:  Aim for 30 minutes of exercise or brisk walking, 6-8 glasses of water, and 5 servings of fruits and vegetables each day.       This is a list of the screening recommended for you and due dates:  Health Maintenance  Topic Date Due   Zoster (Shingles) Vaccine (1 of 2) Never done   COVID-19 Vaccine (3 - Pfizer risk series) 12/26/2019   Flu Shot  05/02/2024   Cologuard (Stool DNA test)  05/13/2024   Medicare Annual Wellness Visit  03/12/2025   DTaP/Tdap/Td vaccine (2 - Td or Tdap) 09/30/2028   Pneumonia Vaccine  Completed   Hepatitis C Screening  Completed   HPV Vaccine  Aged Out   Meningitis B Vaccine  Aged Out    Advanced directives: (Declined) Advance directive discussed with you today. Even though you declined this today, please call our office should you change your mind, and we can give you the proper paperwork for you to fill out. Advance Care Planning is important because it:  [x]  Makes sure you receive the medical care that is consistent with your values, goals, and preferences  [x]  It provides guidance to your family and loved ones and reduces their decisional burden about whether or not they are making the right  decisions based on your wishes.  Follow the link provided in your after visit summary or read over the paperwork we have mailed to you to help you started getting your Advance Directives in place. If you need assistance in completing these, please reach out to us  so that we can help you!  See attachments for Preventive Care and Fall Prevention Tips.

## 2024-05-09 ENCOUNTER — Ambulatory Visit: Payer: Self-pay | Admitting: Family Medicine

## 2024-05-09 ENCOUNTER — Ambulatory Visit: Payer: Medicare PPO | Admitting: Family Medicine

## 2024-05-09 ENCOUNTER — Encounter: Payer: Self-pay | Admitting: Family Medicine

## 2024-05-09 VITALS — BP 120/78 | HR 79 | Temp 98.0°F | Resp 16 | Ht 63.0 in | Wt 171.6 lb

## 2024-05-09 DIAGNOSIS — Z0001 Encounter for general adult medical examination with abnormal findings: Secondary | ICD-10-CM

## 2024-05-09 DIAGNOSIS — E785 Hyperlipidemia, unspecified: Secondary | ICD-10-CM | POA: Diagnosis not present

## 2024-05-09 DIAGNOSIS — Z0000 Encounter for general adult medical examination without abnormal findings: Secondary | ICD-10-CM

## 2024-05-09 DIAGNOSIS — R058 Other specified cough: Secondary | ICD-10-CM

## 2024-05-09 DIAGNOSIS — M65332 Trigger finger, left middle finger: Secondary | ICD-10-CM

## 2024-05-09 LAB — COMPREHENSIVE METABOLIC PANEL WITH GFR
ALT: 16 U/L (ref 0–53)
AST: 14 U/L (ref 0–37)
Albumin: 4.3 g/dL (ref 3.5–5.2)
Alkaline Phosphatase: 49 U/L (ref 39–117)
BUN: 14 mg/dL (ref 6–23)
CO2: 30 meq/L (ref 19–32)
Calcium: 9.2 mg/dL (ref 8.4–10.5)
Chloride: 101 meq/L (ref 96–112)
Creatinine, Ser: 0.79 mg/dL (ref 0.40–1.50)
GFR: 88.94 mL/min (ref >60.00)
Glucose, Bld: 92 mg/dL (ref 70–99)
Potassium: 4.7 meq/L (ref 3.5–5.1)
Sodium: 141 meq/L (ref 135–145)
Total Bilirubin: 0.6 mg/dL (ref 0.2–1.2)
Total Protein: 6.7 g/dL (ref 6.0–8.3)

## 2024-05-09 LAB — CBC
HCT: 44.7 % (ref 39.0–52.0)
Hemoglobin: 14.9 g/dL (ref 13.0–17.0)
MCHC: 33.4 g/dL (ref 30.0–36.0)
MCV: 89 fl (ref 78.0–100.0)
Platelets: 253 K/uL (ref 150.0–400.0)
RBC: 5.02 Mil/uL (ref 4.22–5.81)
RDW: 13.1 % (ref 11.5–15.5)
WBC: 5.9 K/uL (ref 4.0–10.5)

## 2024-05-09 LAB — LIPID PANEL
Cholesterol: 247 mg/dL — ABNORMAL HIGH (ref 0–200)
HDL: 43.7 mg/dL (ref >39.00)
LDL Cholesterol: 179 mg/dL — ABNORMAL HIGH (ref 0–99)
NonHDL: 203.23
Total CHOL/HDL Ratio: 6
Triglycerides: 121 mg/dL (ref 0.0–149.0)
VLDL: 24.2 mg/dL (ref 0.0–40.0)

## 2024-05-09 MED ORDER — FLUTICASONE PROPIONATE 50 MCG/ACT NA SUSP
2.0000 | Freq: Every day | NASAL | 6 refills | Status: AC
Start: 2024-05-09 — End: ?

## 2024-05-09 NOTE — Patient Instructions (Addendum)
 Give us  2-3 business days to get the results of your labs back.   Keep the diet clean and stay active.  I recommend getting the flu shot in mid October. This suggestion would change if the CDC comes out with a different recommendation.   The Shingrix vaccine (for shingles) is a 2 shot series spaced 2-6 months apart. It can make people feel low energy, achy and almost like they have the flu for 48 hours after injection. 1/5 people can have nausea and/or vomiting. Please plan accordingly when deciding on when to get this shot. Call your pharmacy to get this. The second shot of the series is less severe regarding the side effects, but it still lasts 48 hours.   Wear the splint at night and during aggravating activities. I expect improvement in the next month or so.   Claritin (loratadine), Allegra (fexofenadine), Zyrtec (cetirizine) which is also equivalent to Xyzal (levocetirizine); these are listed in order from weakest to strongest. Generic, and therefore cheaper, options are in the parentheses.   Flonase  (fluticasone ); nasal spray that is over the counter. 2 sprays each nostril, once daily. Aim towards the same side eye when you spray.  There are available OTC, and the generic versions, which may be cheaper, are in parentheses. Show this to a pharmacist if you have trouble finding any of these items.  Let us  know if you need anything.

## 2024-05-09 NOTE — Progress Notes (Signed)
 Chief Complaint  Patient presents with   Annual Exam    CPE    Well Male John Zamora is here for a complete physical.   His last physical was >1 year ago.  Current diet: in general, an overall healthy diet.   Current exercise: gardening Weight trend: up a little Fatigue out of ordinary? No. Seat belt? Yes.   Advanced directive? No  Health maintenance Shingrix- No CCS- Yes Tetanus- Yes Hep C- Yes Pneumonia vaccine- Yes  Hand issue 3-4 mo of catching of his L middle finger. No pain. Happens frequently, but not every time. No inj or change in activity. No bruising, redness, swelling, dropping things.   Patient has had a cough, usually dry, over the past 2 months.  Sometimes he feels things are draining in the back of his throat.  He does have silver allergies and took some Claritin but it has not been quite helpful.  He has not tried anything else.  He denies any swelling, chest pain, shortness of breath, wheezing, or swelling.  Drinking hot tea seems to help.  Has not tried anything else.  Past Medical History:  Diagnosis Date   Allergy 03/01/2017   Cataract June 2021   Surgery June 26/July 10   Hemorrhoids      Past Surgical History:  Procedure Laterality Date   ACHILLES TENDON REPAIR Right    CHOLECYSTECTOMY N/A 03/01/2017   Procedure: LAPAROSCOPIC CHOLECYSTECTOMY WITH INTRAOPERATIVE CHOLANGIOGRAM;  Surgeon: Gail Favorite, MD;  Location: [facility];  Service: General;  Laterality: N/A;    Medications  Current Outpatient Medications on File Prior to Visit  Medication Sig Dispense Refill   saw palmetto 160 MG capsule Take 160 mg by mouth 2 (two) times daily.      Allergies Allergies  Allergen Reactions   Ceftriaxone  Rash    Family History Family History  Problem Relation Age of Onset   Alzheimer's disease Mother    Cancer Father        lung cancer (life long smoker) and pancreatic   Hyperlipidemia Brother    Depression Maternal Grandmother     Review of  Systems: Constitutional:  no fevers Eye:  no recent significant change in vision Ears:  No changes in hearing Nose/Mouth/Throat:  no complaints of nasal congestion, no sore throat Cardiovascular: no chest pain Respiratory:  No shortness of breath, + cough Gastrointestinal:  No change in bowel habits GU:  No frequency MSK: No pain, + triggering of the left third digit Integumentary:  no abnormal skin lesions reported Neurologic:  no headaches Endocrine:  denies unexplained weight changes  Exam BP 120/78 (BP Location: Left Arm, Patient Position: Sitting)   Pulse 79   Temp 98 F (36.7 C) (Oral)   Resp 16   Ht 5' 3 (1.6 m)   Wt 171 lb 9.6 oz (77.8 kg)   SpO2 96%   BMI 30.40 kg/m  General:  well developed, well nourished, in no apparent distress Skin:  no significant moles, warts, or growths Head:  no masses, lesions, or tenderness Eyes:  pupils equal and round, sclera anicteric without injection Ears:  canals without lesions, TMs shiny without retraction, no obvious effusion, no erythema Nose:  nares patent, mucosa normal Throat/Pharynx:  lips and gingiva without lesion; tongue and uvula midline; non-inflamed pharynx; no exudates or postnasal drainage Lungs:  clear to auscultation, breath sounds equal bilaterally, no respiratory distress Cardio:  regular rate and rhythm, no LE edema or bruits Rectal: Deferred GI: BS+, S, NT,  ND, no masses or organomegaly Musculoskeletal: Slight triggering of the left third digit.  TTP over the distal palmar third flexor tendon.  No obvious deformity.  Otherwise, symmetrical muscle groups noted without atrophy or deformity Neuro:  gait normal; deep tendon reflexes normal and symmetric Psych: well oriented with normal range of affect and appropriate judgment/insight  Assessment and Plan  Well adult exam  Hyperlipidemia, unspecified hyperlipidemia type - Plan: Comprehensive metabolic panel with GFR, Lipid panel, CBC  Trigger middle finger of  left hand  Upper airway cough syndrome - Plan: fluticasone  (FLONASE ) 50 MCG/ACT nasal spray   Well 73 y.o. male. Counseled on diet and exercise. Advanced directive form requested today.  Shingrix rec'd.  Trigger finger: Chronic, not controlled.  Not terribly inflamed.  Frog splint given today to wear at night and during aggravating activities.  If no significant improvement the next month or so, he will let us  know and we will consider an injection.  He may decide to do nothing. Cough: Seems to be related to his summer allergies.  Start Flonase  daily.  Consider adding oral antihistamine in addition to.  If no improvement in next couple months, he will let us  know. Other orders as above. Follow up in 1 yr.  The patient voiced understanding and agreement to the plan.  Mabel [place], DO 05/09/24 7:44 AM

## 2025-03-18 ENCOUNTER — Ambulatory Visit

## 2025-08-24 ENCOUNTER — Encounter: Admitting: Family Medicine
